# Patient Record
Sex: Female | Born: 1970 | Race: Black or African American | Hispanic: No | Marital: Single | Smoking: Former smoker
Health system: Southern US, Community
[De-identification: ages and names within clinical notes are randomized; demographics above are authoritative.]

## PROBLEM LIST (undated history)

## (undated) DIAGNOSIS — D649 Anemia, unspecified: Secondary | ICD-10-CM

## (undated) DIAGNOSIS — Z5189 Encounter for other specified aftercare: Secondary | ICD-10-CM

## (undated) DIAGNOSIS — I10 Essential (primary) hypertension: Secondary | ICD-10-CM

## (undated) HISTORY — DX: Essential (primary) hypertension: I10

## (undated) HISTORY — DX: Encounter for other specified aftercare: Z51.89

## (undated) HISTORY — DX: Anemia, unspecified: D64.9

## (undated) HISTORY — PX: WISDOM TOOTH EXTRACTION: SHX21

---

## 1998-10-12 ENCOUNTER — Emergency Department (HOSPITAL_COMMUNITY): Admission: EM | Admit: 1998-10-12 | Discharge: 1998-10-12 | Payer: Self-pay | Admitting: Emergency Medicine

## 1999-02-23 ENCOUNTER — Emergency Department (HOSPITAL_COMMUNITY): Admission: EM | Admit: 1999-02-23 | Discharge: 1999-02-24 | Payer: Self-pay | Admitting: Emergency Medicine

## 1999-02-24 ENCOUNTER — Encounter: Payer: Self-pay | Admitting: Emergency Medicine

## 1999-04-06 ENCOUNTER — Other Ambulatory Visit: Admission: RE | Admit: 1999-04-06 | Discharge: 1999-04-06 | Payer: Self-pay | Admitting: Family Medicine

## 1999-12-20 ENCOUNTER — Emergency Department (HOSPITAL_COMMUNITY): Admission: EM | Admit: 1999-12-20 | Discharge: 1999-12-21 | Payer: Self-pay | Admitting: Emergency Medicine

## 1999-12-22 ENCOUNTER — Encounter: Payer: Self-pay | Admitting: Emergency Medicine

## 1999-12-22 ENCOUNTER — Ambulatory Visit (HOSPITAL_COMMUNITY): Admission: RE | Admit: 1999-12-22 | Discharge: 1999-12-22 | Payer: Self-pay | Admitting: Emergency Medicine

## 2001-11-12 ENCOUNTER — Other Ambulatory Visit: Admission: RE | Admit: 2001-11-12 | Discharge: 2001-11-12 | Payer: Self-pay | Admitting: Family Medicine

## 2003-07-25 ENCOUNTER — Other Ambulatory Visit: Admission: RE | Admit: 2003-07-25 | Discharge: 2003-07-25 | Payer: Self-pay | Admitting: Family Medicine

## 2004-02-28 ENCOUNTER — Emergency Department (HOSPITAL_COMMUNITY): Admission: EM | Admit: 2004-02-28 | Discharge: 2004-02-28 | Payer: Self-pay | Admitting: Emergency Medicine

## 2004-08-10 ENCOUNTER — Other Ambulatory Visit: Admission: RE | Admit: 2004-08-10 | Discharge: 2004-08-10 | Payer: Self-pay | Admitting: *Deleted

## 2005-09-19 ENCOUNTER — Other Ambulatory Visit: Admission: RE | Admit: 2005-09-19 | Discharge: 2005-09-19 | Payer: Self-pay | Admitting: Family Medicine

## 2008-09-01 ENCOUNTER — Emergency Department (HOSPITAL_COMMUNITY): Admission: EM | Admit: 2008-09-01 | Discharge: 2008-09-01 | Payer: Self-pay | Admitting: Emergency Medicine

## 2009-02-09 ENCOUNTER — Ambulatory Visit (HOSPITAL_COMMUNITY): Admission: RE | Admit: 2009-02-09 | Discharge: 2009-02-09 | Payer: Self-pay | Admitting: Family Medicine

## 2011-06-21 ENCOUNTER — Encounter: Payer: Self-pay | Admitting: Obstetrics and Gynecology

## 2011-06-28 ENCOUNTER — Encounter: Payer: Self-pay | Admitting: Obstetrics and Gynecology

## 2011-06-28 ENCOUNTER — Ambulatory Visit (INDEPENDENT_AMBULATORY_CARE_PROVIDER_SITE_OTHER): Payer: Private Health Insurance - Indemnity | Admitting: Obstetrics and Gynecology

## 2011-06-28 VITALS — BP 120/82 | HR 70 | Ht 64.5 in | Wt 209.0 lb

## 2011-06-28 DIAGNOSIS — Z1231 Encounter for screening mammogram for malignant neoplasm of breast: Secondary | ICD-10-CM

## 2011-06-28 DIAGNOSIS — Z309 Encounter for contraceptive management, unspecified: Secondary | ICD-10-CM

## 2011-06-28 DIAGNOSIS — IMO0001 Reserved for inherently not codable concepts without codable children: Secondary | ICD-10-CM

## 2011-06-28 DIAGNOSIS — Z01419 Encounter for gynecological examination (general) (routine) without abnormal findings: Secondary | ICD-10-CM

## 2011-06-28 DIAGNOSIS — Z124 Encounter for screening for malignant neoplasm of cervix: Secondary | ICD-10-CM

## 2011-06-28 LAB — POCT URINE PREGNANCY: Preg Test, Ur: NEGATIVE

## 2011-06-28 MED ORDER — ETONOGESTREL-ETHINYL ESTRADIOL 0.12-0.015 MG/24HR VA RING: VAGINAL_RING | VAGINAL | Status: DC

## 2011-06-28 NOTE — Progress Notes (Signed)
Subjective:    Alexis Torres is a 41 y.o. female, G2P0011, who presents for an annual exam. The patient has used BCPs, and Depo Provera in the past but had trouble remembering and gained weight respectively.  Reviewed methods both medical and over the counter and gave a handout on the same.  Wants to try Nuvaring.  Menstrual cycle:   LMP: Patient's last menstrual period was 06/20/2011.             Review of Systems Pertinent items are noted in HPI. Denies pelvic pain, urinary tract symptoms, vaginitis symptoms, irregular bleeding, menopausal symptoms, change in bowel habits or rectal bleeding   Objective:    BP 120/82  Pulse 70  Ht 5' 4.5" (1.638 m)  Wt 209 lb (94.802 kg)  BMI 35.32 kg/m2  LMP 06/20/2011   Wt Readings from Last 1 Encounters:  06/28/11 209 lb (94.802 kg)   Body mass index is 35.32 kg/(m^2). General Appearance: Alert, no acute distress HEENT: Grossly normal Neck / Thyroid: Supple, no thyromegaly or cervical adenopathy Lungs: Clear to auscultation bilaterally Back: No CVA tenderness Breast Exam: No masses or nodes.No dimpling, nipple retraction or discharge. Cardiovascular: Regular rate and rhythm.  Gastrointestinal: Soft, non-tender, no masses or organomegaly Pelvic Exam: EGBUS-wnl, vagina-normal rugae, cervix- without lesions or tenderness, uterus upper limits of normal size;  adnexae-no masses or tenderness Rectovaginal: no masses and normal sphincter tone Lymphatic Exam: Non-palpable nodes in neck, clavicular,  axillary, or inguinal regions  Skin: no rashes or abnormalities Extremities: no clubbing cyanosis or edema  Neurologic: grossly normal Psychiatric: Alert and oriented  Assessment:   Routine GYN Exam Need for Contraception   Plan:  Nuvaring #1 1 pv for 21 of 28 days 11 refills  Nuvagring booklet given along with handout on all contraceptive methods  Reviewed side effects, risks and benefits of Nuvaring to include VTE complications  PAP  sent  Initial mammogram scheduled  RTO 1 year or prn  Danile Trier,ELMIRAPA-C

## 2011-06-28 NOTE — Progress Notes (Signed)
Regular Periods: yes Mammogram: no  Monthly Breast Ex.: yes Exercise: yes  Tetanus < 10 years: yes Seatbelts: yes  NI. Bladder Functn.: yes Abuse at home: no  Daily BM's: yes Stressful Work: yes  Healthy Diet: yes Sigmoid-Colonoscopy: NO  Calcium: no Medical problems this year:  DISCUSS BIRTH CONTROL   LAST PAP2011  Contraception: ABSTINENCE  Mammogram:  TIME FOR MAMMO  PCP: NO   PMH: NO CHANGE  FMH: NO CHANGE  Last Bone Scan: NO

## 2011-06-30 LAB — PAP IG W/ RFLX HPV ASCU

## 2011-07-04 LAB — HUMAN PAPILLOMAVIRUS, HIGH RISK: HPV DNA High Risk: NOT DETECTED

## 2011-07-06 ENCOUNTER — Encounter: Payer: Self-pay | Admitting: Obstetrics and Gynecology

## 2011-08-11 ENCOUNTER — Ambulatory Visit (HOSPITAL_COMMUNITY)
Admission: RE | Admit: 2011-08-11 | Discharge: 2011-08-11 | Disposition: A | Discharge status: Home / Self Care | Payer: Private Health Insurance - Indemnity | Source: Ambulatory Visit | Attending: Obstetrics and Gynecology | Admitting: Obstetrics and Gynecology

## 2011-08-11 DIAGNOSIS — Z1231 Encounter for screening mammogram for malignant neoplasm of breast: Secondary | ICD-10-CM

## 2013-11-11 ENCOUNTER — Encounter: Payer: Self-pay | Admitting: Obstetrics and Gynecology

## 2014-05-14 ENCOUNTER — Emergency Department (HOSPITAL_COMMUNITY)
Admission: EM | Admit: 2014-05-14 | Discharge: 2014-05-15 | Disposition: A | Discharge status: Home / Self Care | Payer: Private Health Insurance - Indemnity | Attending: Emergency Medicine | Admitting: Emergency Medicine

## 2014-05-14 ENCOUNTER — Emergency Department (HOSPITAL_COMMUNITY): Payer: Private Health Insurance - Indemnity

## 2014-05-14 ENCOUNTER — Encounter (HOSPITAL_COMMUNITY): Payer: Self-pay | Admitting: Emergency Medicine

## 2014-05-14 DIAGNOSIS — Z87891 Personal history of nicotine dependence: Secondary | ICD-10-CM | POA: Insufficient documentation

## 2014-05-14 DIAGNOSIS — R079 Chest pain, unspecified: Secondary | ICD-10-CM

## 2014-05-14 DIAGNOSIS — D649 Anemia, unspecified: Secondary | ICD-10-CM | POA: Insufficient documentation

## 2014-05-14 MED ORDER — KETOROLAC TROMETHAMINE 30 MG/ML IJ SOLN
30.0000 mg | Freq: Once | INTRAMUSCULAR | Status: AC
  Administered 2014-05-14: 30 mg via INTRAMUSCULAR
  Filled 2014-05-14: qty 1

## 2014-05-14 NOTE — ED Notes (Signed)
Pt states has a knot on mid chest/R breast area, states same spot she had one before about month and half ago, states she thinks its like a cyst she gets under her arms, states once she realized it was there she started having some shooting pains in chest at times, denies pain at this time. Pt in no distress.

## 2014-05-14 NOTE — ED Notes (Signed)
Pt states that she has a knot in between her breasts. States that she had it before and it went away and came back. Had some chest pain today that has now stopped. Alert and oriented.

## 2014-05-15 LAB — CBC WITH DIFFERENTIAL/PLATELET
BASOS PCT: 0 % (ref 0–1)
Basophils Absolute: 0 10*3/uL (ref 0.0–0.1)
EOS ABS: 0.3 10*3/uL (ref 0.0–0.7)
Eosinophils Relative: 3 % (ref 0–5)
HEMATOCRIT: 31.8 % — AB (ref 36.0–46.0)
HEMOGLOBIN: 9.4 g/dL — AB (ref 12.0–15.0)
LYMPHS ABS: 2.8 10*3/uL (ref 0.7–4.0)
Lymphocytes Relative: 30 % (ref 12–46)
MCH: 21.4 pg — ABNORMAL LOW (ref 26.0–34.0)
MCHC: 29.6 g/dL — ABNORMAL LOW (ref 30.0–36.0)
MCV: 72.4 fL — AB (ref 78.0–100.0)
MONO ABS: 0.8 10*3/uL (ref 0.1–1.0)
MONOS PCT: 8 % (ref 3–12)
Neutro Abs: 5.5 10*3/uL (ref 1.7–7.7)
Neutrophils Relative %: 59 % (ref 43–77)
Platelets: 440 10*3/uL — ABNORMAL HIGH (ref 150–400)
RBC: 4.39 MIL/uL (ref 3.87–5.11)
RDW: 16.1 % — AB (ref 11.5–15.5)
WBC: 9.4 10*3/uL (ref 4.0–10.5)

## 2014-05-15 LAB — I-STAT TROPONIN, ED: Troponin i, poc: 0 ng/mL (ref 0.00–0.08)

## 2014-05-15 LAB — BASIC METABOLIC PANEL
ANION GAP: 4 — AB (ref 5–15)
BUN: 14 mg/dL (ref 6–20)
CALCIUM: 8.9 mg/dL (ref 8.9–10.3)
CO2: 28 mmol/L (ref 22–32)
CREATININE: 0.7 mg/dL (ref 0.44–1.00)
Chloride: 104 mmol/L (ref 101–111)
GFR calc non Af Amer: >60 mL/min (ref >60)
Glucose, Bld: 105 mg/dL — ABNORMAL HIGH (ref 70–99)
Potassium: 4.1 mmol/L (ref 3.5–5.1)
Sodium: 136 mmol/L (ref 135–145)

## 2014-05-15 MED ORDER — FERROUS SULFATE 325 (65 FE) MG PO TABS: 325.0000 mg | ORAL_TABLET | Freq: Every day | ORAL | Status: DC

## 2014-05-15 NOTE — ED Provider Notes (Signed)
CSN: [id number]     Arrival date & time 05/14/14  2014 History   First MD Initiated Contact with Patient 05/14/14 2205     Chief Complaint  Patient presents with  . Knot on Chest      (Consider location/radiation/quality/duration/timing/severity/associated sxs/prior Treatment)  HPI Comments: Patient presents today with a complaint of a knot of her breast.  She reports that she first noticed the knot today and that it has been unchanged since onset.  She states that she had a similar knot a month ago that resolved without intervention.  She also reports that earlier today she began having chest pain inferior to both breasts.  Pain has been constant since that time.  She denies SOB, fever, chills, nausea, vomiting, or diaphoresis.  She denies prior history of cardiac disease.  She denies history of HTN, DM, Hyperlipidemia.  She reports that she smokes 2 cigarettes a month.  Denies prolonged travel or surgeries in the past 4 weeks.  Denies exogenous estrogen use.  Denies LE edema or pain.    The history is provided by the patient.    History reviewed. No pertinent past medical history. Past Surgical History  Procedure Laterality Date  . Wisdom tooth extraction     Family History  Problem Relation Age of Onset  . Heart disease Father   . Cancer Mother     COLON  . Seizures Sister     EPILEPSY   History  Substance Use Topics  . Smoking status: Former Research scientist (life sciences)  . Smokeless tobacco: Never Used     Comment: QUIT IN DEC.  Marland Kitchen Alcohol Use: Yes     Comment: 1 TO 2 BEERS A WEEK   OB History    Gravida Para Term Preterm AB TAB SAB Ectopic Multiple Living   2 1   1     1      Review of Systems  All other systems reviewed and are negative.     Allergies  Review of patient's allergies indicates no known allergies.  Home Medications   Prior to Admission medications   Medication Sig Start Date End Date Taking? Authorizing Provider  etonogestrel-ethinyl estradiol (NUVARING) 0.12-0.015  MG/24HR vaginal ring Insert vaginally and leave in place for 3 consecutive weeks, then remove for 1 week. 06/28/11 06/27/12  Elmira Powell, PA-C   BP 156/87 mmHg  Pulse 61  Temp(Src) 98.4 F (36.9 C) (Oral)  Resp 18  SpO2 100%  LMP 05/05/2014 Physical Exam  Constitutional: She appears well-developed and well-nourished.  HENT:  Head: Normocephalic and atraumatic.  Mouth/Throat: Oropharynx is clear and moist.  Neck: Normal range of motion. Neck supple.  Cardiovascular: Normal rate, regular rhythm and normal heart sounds.   Pulmonary/Chest: Effort normal and breath sounds normal. No respiratory distress. She has no wheezes. She has no rales. She exhibits tenderness.    Abdominal: Soft. Bowel sounds are normal.  Musculoskeletal: Normal range of motion.  Neurological: She is alert.  Skin: Skin is warm and dry.  Psychiatric: She has a normal mood and affect.  Nursing note and vitals reviewed.   ED Course  Procedures (including critical care time) Labs Review Labs Reviewed  CBC WITH DIFFERENTIAL/PLATELET - Abnormal; Notable for the following:    Hemoglobin 9.4 (*)    HCT 31.8 (*)    MCV 72.4 (*)    MCH 21.4 (*)    MCHC 29.6 (*)    RDW 16.1 (*)    Platelets 440 (*)    All other components  within normal limits  BASIC METABOLIC PANEL - Abnormal; Notable for the following:    Glucose, Bld 105 (*)    Anion gap 4 (*)    All other components within normal limits  I-STAT TROPOININ, ED    Imaging Review Dg Chest 2 View  05/15/2014   CLINICAL DATA:  Substernal chest pain for 24 hr  EXAM: CHEST  2 VIEW  COMPARISON:  None.  FINDINGS: Normal mediastinum and cardiac silhouette. Normal pulmonary vasculature. No evidence of effusion, infiltrate, or pneumothorax. No acute bony abnormality.  IMPRESSION: No acute cardiopulmonary process.   Electronically Signed   By: Suzy Bouchard M.D.   On: 05/15/2014 00:09     EKG Interpretation   Date/Time:  Wednesday May 14 2014 20:21:16  EDT Ventricular Rate:  72 PR Interval:  208 QRS Duration: 85 QT Interval:  374 QTC Calculation: 409 R Axis:   50 Text Interpretation:  Sinus rhythm Ventricular premature complex  Borderline prolonged PR interval No old tracing to compare Confirmed by  BELFI  MD, MELANIE ([id number]) on 05/14/2014 11:14:11 PM      MDM   Final diagnoses:  Chest pain   Patient presents today with a knot of her breast.  She reports that she just noticed the area today.  No obvious fluctuant abscess.  No overlying erythema or warmth.  Patient given referral to breast center.  Patient is afebrile.  Patient also reporting chest pain.  Troponin negative.  CXR negative.  No ischemic changes on EKG.  HEART score of 1.  Feel that the patient is stable for discharge.  Return precautions given.    Hyman Bible, PA-C 05/16/14 [id number]  Malvin Johns, MD 05/16/14 [phone]

## 2014-05-15 NOTE — Discharge Instructions (Signed)
It is important for you to follow up with the Breast Center or your Primary Care Physician regarding the knot on your breast.

## 2015-04-30 ENCOUNTER — Encounter (HOSPITAL_COMMUNITY): Payer: Self-pay

## 2015-04-30 ENCOUNTER — Emergency Department (HOSPITAL_COMMUNITY): Payer: 59

## 2015-04-30 ENCOUNTER — Emergency Department (HOSPITAL_COMMUNITY)
Admission: EM | Admit: 2015-04-30 | Discharge: 2015-04-30 | Disposition: A | Discharge status: Home / Self Care | Payer: 59 | Attending: Emergency Medicine | Admitting: Emergency Medicine

## 2015-04-30 DIAGNOSIS — R0602 Shortness of breath: Secondary | ICD-10-CM | POA: Diagnosis not present

## 2015-04-30 DIAGNOSIS — R079 Chest pain, unspecified: Secondary | ICD-10-CM | POA: Diagnosis present

## 2015-04-30 DIAGNOSIS — Z7982 Long term (current) use of aspirin: Secondary | ICD-10-CM | POA: Insufficient documentation

## 2015-04-30 DIAGNOSIS — R2242 Localized swelling, mass and lump, left lower limb: Secondary | ICD-10-CM | POA: Insufficient documentation

## 2015-04-30 DIAGNOSIS — Z87891 Personal history of nicotine dependence: Secondary | ICD-10-CM | POA: Diagnosis not present

## 2015-04-30 DIAGNOSIS — R0789 Other chest pain: Secondary | ICD-10-CM | POA: Insufficient documentation

## 2015-04-30 LAB — BASIC METABOLIC PANEL
Anion gap: 6 (ref 5–15)
BUN: 10 mg/dL (ref 6–20)
CHLORIDE: 105 mmol/L (ref 101–111)
CO2: 26 mmol/L (ref 22–32)
Calcium: 9 mg/dL (ref 8.9–10.3)
Creatinine, Ser: 0.78 mg/dL (ref 0.44–1.00)
GFR calc non Af Amer: >60 mL/min (ref >60)
Glucose, Bld: 103 mg/dL — ABNORMAL HIGH (ref 65–99)
Potassium: 3.8 mmol/L (ref 3.5–5.1)
Sodium: 137 mmol/L (ref 135–145)

## 2015-04-30 LAB — CBC
HCT: 29.7 % — ABNORMAL LOW (ref 36.0–46.0)
Hemoglobin: 9.2 g/dL — ABNORMAL LOW (ref 12.0–15.0)
MCH: 21.5 pg — AB (ref 26.0–34.0)
MCHC: 31 g/dL (ref 30.0–36.0)
MCV: 69.6 fL — AB (ref 78.0–100.0)
Platelets: 443 10*3/uL — ABNORMAL HIGH (ref 150–400)
RBC: 4.27 MIL/uL (ref 3.87–5.11)
RDW: 16.7 % — ABNORMAL HIGH (ref 11.5–15.5)
WBC: 10.5 10*3/uL (ref 4.0–10.5)

## 2015-04-30 LAB — I-STAT TROPONIN, ED: Troponin i, poc: 0 ng/mL (ref 0.00–0.08)

## 2015-04-30 LAB — D-DIMER, QUANTITATIVE: D-Dimer, Quant: 0.58 ug/mL-FEU — ABNORMAL HIGH (ref 0.00–0.50)

## 2015-04-30 MED ORDER — IOPAMIDOL (ISOVUE-370) INJECTION 76%
100.0000 mL | Freq: Once | INTRAVENOUS | Status: AC | PRN
  Administered 2015-04-30: 100 mL via INTRAVENOUS

## 2015-04-30 NOTE — ED Notes (Signed)
Patient states she was shopping and began having left chest pain that radiated to the left mid back. Patient states the chest pain has been intermittent since then. Pain is worse with a deep breath, movement,or cough. Patient states she gets SOB when walking up stairs.

## 2015-04-30 NOTE — Discharge Instructions (Signed)
Nonspecific Chest Pain  °Chest pain can be caused by many different conditions. There is always a chance that your pain could be related to something serious, such as a heart attack or a blood clot in your lungs. Chest pain can also be caused by conditions that are not life-threatening. If you have chest pain, it is very important to follow up with your health care provider. °CAUSES  °Chest pain can be caused by: °· Heartburn. °· Pneumonia or bronchitis. °· Anxiety or stress. °· Inflammation around your heart (pericarditis) or lung (pleuritis or pleurisy). °· A blood clot in your lung. °· A collapsed lung (pneumothorax). It can develop suddenly on its own (spontaneous pneumothorax) or from trauma to the chest. °· Shingles infection (varicella-zoster virus). °· Heart attack. °· Damage to the bones, muscles, and cartilage that make up your chest wall. This can include: °¨ Bruised bones due to injury. °¨ Strained muscles or cartilage due to frequent or repeated coughing or overwork. °¨ Fracture to one or more ribs. °¨ Sore cartilage due to inflammation (costochondritis). °RISK FACTORS  °Risk factors for chest pain may include: °· Activities that increase your risk for trauma or injury to your chest. °· Respiratory infections or conditions that cause frequent coughing. °· Medical conditions or overeating that can cause heartburn. °· Heart disease or family history of heart disease. °· Conditions or health behaviors that increase your risk of developing a blood clot. °· Having had chicken pox (varicella zoster). °SIGNS AND SYMPTOMS °Chest pain can feel like: °· Burning or tingling on the surface of your chest or deep in your chest. °· Crushing, pressure, aching, or squeezing pain. °· Dull or sharp pain that is worse when you move, cough, or take a deep breath. °· Pain that is also felt in your back, neck, shoulder, or arm, or pain that spreads to any of these areas. °Your chest pain may come and go, or it may stay  constant. °DIAGNOSIS °Lab tests or other studies may be needed to find the cause of your pain. Your health care provider may have you take a test called an ambulatory ECG (electrocardiogram). An ECG records your heartbeat patterns at the time the test is performed. You may also have other tests, such as: °· Transthoracic echocardiogram (TTE). During echocardiography, sound waves are used to create a picture of all of the heart structures and to look at how blood flows through your heart. °· Transesophageal echocardiogram (TEE). This is a more advanced imaging test that obtains images from inside your body. It allows your health care provider to see your heart in finer detail. °· Cardiac monitoring. This allows your health care provider to monitor your heart rate and rhythm in real time. °· Holter monitor. This is a portable device that records your heartbeat and can help to diagnose abnormal heartbeats. It allows your health care provider to track your heart activity for several days, if needed. °· Stress tests. These can be done through exercise or by taking medicine that makes your heart beat more quickly. °· Blood tests. °· Imaging tests. °TREATMENT  °Your treatment depends on what is causing your chest pain. Treatment may include: °· Medicines. These may include: °¨ Acid blockers for heartburn. °¨ Anti-inflammatory medicine. °¨ Pain medicine for inflammatory conditions. °¨ Antibiotic medicine, if an infection is present. °¨ Medicines to dissolve blood clots. °¨ Medicines to treat coronary artery disease. °· Supportive care for conditions that do not require medicines. This may include: °¨ Resting. °¨ Applying heat   or cold packs to injured areas. °¨ Limiting activities until pain decreases. °HOME CARE INSTRUCTIONS °· If you were prescribed an antibiotic medicine, finish it all even if you start to feel better. °· Avoid any activities that bring on chest pain. °· Do not use any tobacco products, including  cigarettes, chewing tobacco, or electronic cigarettes. If you need help quitting, ask your health care provider. °· Do not drink alcohol. °· Take medicines only as directed by your health care provider. °· Keep all follow-up visits as directed by your health care provider. This is important. This includes any further testing if your chest pain does not go away. °· If heartburn is the cause for your chest pain, you may be told to keep your head raised (elevated) while sleeping. This reduces the chance that acid will go from your stomach into your esophagus. °· Make lifestyle changes as directed by your health care provider. These may include: °¨ Getting regular exercise. Ask your health care provider to suggest some activities that are safe for you. °¨ Eating a heart-healthy diet. A registered dietitian can help you to learn healthy eating options. °¨ Maintaining a healthy weight. °¨ Managing diabetes, if necessary. °¨ Reducing stress. °SEEK MEDICAL CARE IF: °· Your chest pain does not go away after treatment. °· You have a rash with blisters on your chest. °· You have a fever. °SEEK IMMEDIATE MEDICAL CARE IF:  °· Your chest pain is worse. °· You have an increasing cough, or you cough up blood. °· You have severe abdominal pain. °· You have severe weakness. °· You faint. °· You have chills. °· You have sudden, unexplained chest discomfort. °· You have sudden, unexplained discomfort in your arms, back, neck, or jaw. °· You have shortness of breath at any time. °· You suddenly start to sweat, or your skin gets clammy. °· You feel nauseous or you vomit. °· You suddenly feel light-headed or dizzy. °· Your heart begins to beat quickly, or it feels like it is skipping beats. °These symptoms may represent a serious problem that is an emergency. Do not wait to see if the symptoms will go away. Get medical help right away. Call your local emergency services (911 in the [place] Do not drive yourself to the hospital. °  °This  information is not intended to replace advice given to you by your health care provider. Make sure you discuss any questions you have with your health care provider. °  °Document Released: 10/06/2004 Document Revised: 01/17/2014 Document Reviewed: 08/02/2013 °Elsevier Interactive Patient Education ©2016 [employer] ° °

## 2015-04-30 NOTE — ED Notes (Signed)
MD at bedside. 

## 2015-04-30 NOTE — ED Provider Notes (Signed)
CSN: [id number]     Arrival date & time 04/30/15  1656 History   First MD Initiated Contact with Patient 04/30/15 2022     Chief Complaint  Patient presents with  . Chest Pain    Patient is a 45 y.o. female presenting with chest pain.  Chest Pain Associated symptoms: shortness of breath   Associated symptoms: no abdominal pain, no back pain, no cough, no headache, no nausea, no numbness, not vomiting and no weakness   Patient presents with chest pain. She's had for last few days. Is on her left lower chest. It does come and go somewhat. Comes on sharp. States worse with breathing. Not worse with exertion. It does go away in between but gradually improves. No fevers or chills. She is a former smoker. States she has had some swelling of left foot but has resolved. States she has had more shortness of breath over the last couple weeks. States she's noticed it more since the chest pain started. No Abdominal pain. No fevers. No diaphoresis. No recent travel. She is not on hormonal treatment.  History reviewed. No pertinent past medical history. Past Surgical History  Procedure Laterality Date  . Wisdom tooth extraction     Family History  Problem Relation Age of Onset  . Heart disease Father   . Cancer Mother     COLON  . Seizures Sister     EPILEPSY   Social History  Substance Use Topics  . Smoking status: Former Research scientist (life sciences)  . Smokeless tobacco: Never Used     Comment: QUIT IN DEC.  Marland Kitchen Alcohol Use: No   OB History    Gravida Para Term Preterm AB TAB SAB Ectopic Multiple Living   2 1   1     1      Review of Systems  Constitutional: Negative for activity change and appetite change.  Eyes: Negative for pain.  Respiratory: Positive for shortness of breath. Negative for cough and chest tightness.   Cardiovascular: Positive for chest pain. Negative for leg swelling.  Gastrointestinal: Negative for nausea, vomiting, abdominal pain and diarrhea.  Genitourinary: Negative for flank pain.   Musculoskeletal: Negative for back pain and neck stiffness.  Skin: Negative for rash.  Neurological: Negative for weakness, numbness and headaches.  Psychiatric/Behavioral: Negative for behavioral problems.      Allergies  Review of patient's allergies indicates no known allergies.  Home Medications   Prior to Admission medications   Medication Sig Start Date End Date Taking? Authorizing Provider  aspirin EC 325 MG tablet Take 325 mg by mouth every 6 (six) hours as needed for moderate pain.   Yes Historical Provider, MD  naproxen sodium (ANAPROX) 220 MG tablet Take 220 mg by mouth 2 (two) times daily as needed (pain).   Yes Historical Provider, MD  etonogestrel-ethinyl estradiol (NUVARING) 0.12-0.015 MG/24HR vaginal ring Insert vaginally and leave in place for 3 consecutive weeks, then remove for 1 week. 06/28/11 06/27/12  Earnstine Regal, PA-C  ferrous sulfate 325 (65 FE) MG tablet Take 1 tablet (325 mg total) by mouth daily. Patient not taking: Reported on 04/30/2015 05/15/14   Hyman Bible, PA-C   BP 147/78 mmHg  Pulse 60  Temp(Src) 98.5 F (36.9 C) (Oral)  Resp 20  SpO2 97%  LMP 04/16/2015 Physical Exam  Constitutional: She is oriented to person, place, and time. She appears well-developed and well-nourished.  HENT:  Head: Normocephalic and atraumatic.  Neck: No JVD present.  Cardiovascular: Normal rate, regular rhythm and normal  heart sounds.   No murmur heard. Pulmonary/Chest: Effort normal and breath sounds normal. No respiratory distress. She has no wheezes. She has no rales. She exhibits tenderness.  Tenderness to left anterior lower chest wall. No rash. No crepitance or deformity.  Abdominal: Soft. Bowel sounds are normal. She exhibits no distension. There is no tenderness. There is no rebound and no guarding.  Musculoskeletal: Normal range of motion. She exhibits no edema.  Neurological: She is alert and oriented to person, place, and time. No cranial nerve deficit.   Skin: Skin is warm and dry.  Psychiatric: Her speech is normal.  Nursing note and vitals reviewed.   ED Course  Procedures (including critical care time) Labs Review Labs Reviewed  BASIC METABOLIC PANEL - Abnormal; Notable for the following:    Glucose, Bld 103 (*)    All other components within normal limits  CBC - Abnormal; Notable for the following:    Hemoglobin 9.2 (*)    HCT 29.7 (*)    MCV 69.6 (*)    MCH 21.5 (*)    RDW 16.7 (*)    Platelets 443 (*)    All other components within normal limits  D-DIMER, QUANTITATIVE (NOT AT [facility]) - Abnormal; Notable for the following:    D-Dimer, Quant 0.58 (*)    All other components within normal limits  I-STAT TROPOININ, ED    Imaging Review Dg Chest 2 View  04/30/2015  CLINICAL DATA:  Left-sided chest pain radiating into the back since noon today. Shortness of breath with exertion. Initial encounter. EXAM: CHEST  2 VIEW COMPARISON:  PA and lateral chest 05/14/2014. FINDINGS: Lung volumes are slightly lower than on the comparison study resulting in accentuation of the cardiac silhouette. The lungs are clear. There is no pneumothorax or pleural effusion. No focal bony abnormality. IMPRESSION: No acute disease. Electronically Signed   By: Inge Rise M.D.   On: 04/30/2015 17:59   Ct Angio Chest Pe W/cm &/or Wo Cm  04/30/2015  CLINICAL DATA:  Left-sided chest pain radiating to the left mid back. Shortness of breath when walking up stairs. Elevated D-dimer. Ex-smoker. EXAM: CT ANGIOGRAPHY CHEST WITH CONTRAST TECHNIQUE: Multidetector CT imaging of the chest was performed using the standard protocol during bolus administration of intravenous contrast. Multiplanar CT image reconstructions and MIPs were obtained to evaluate the vascular anatomy. CONTRAST:  72 mL Isovue 370 COMPARISON:  None. FINDINGS: Technically adequate study with good opacification of the central and segmental pulmonary arteries. No focal filling defects. No evidence of  significant pulmonary embolus. Normal heart size. Normal caliber thoracic aorta. No evidence of aortic dissection. Great vessel origins are patent. Esophagus is decompressed. Moderate prominent lymph nodes in the axilla bilaterally, likely reactive. No significant mediastinal lymphadenopathy. Mild dependent atelectasis in the lung bases. No focal airspace disease or consolidation in the lungs. No pleural effusions. No pneumothorax. Airways are patent. Included portions of the upper abdominal organs are grossly unremarkable. Mild degenerative changes in the spine. No destructive bone lesions. Review of the MIP images confirms the above findings. IMPRESSION: No evidence of significant pulmonary embolus. No evidence of active pulmonary disease. Electronically Signed   By: Lucienne Capers M.D.   On: 04/30/2015 21:52   I have personally reviewed and evaluated these images and lab results as part of my medical decision-making.   EKG Interpretation   Date/Time:  Thursday April 30 2015 17:18:18 EDT Ventricular Rate:  82 PR Interval:  172 QRS Duration: 83 QT Interval:  374 QTC  Calculation: 437 R Axis:   80 Text Interpretation:  Sinus rhythm Borderline T abnormalities, anterior  leads Confirmed by Alvino Chapel  MD, Sanjit Mcmichael [id number]) on 04/30/2015 8:23:58 PM      MDM   Final diagnoses:  Chest pain, unspecified chest pain type    Patient presents with chest pain. Did have swelling the foot and reported dyspnea. D-dimer done and was not negative. CT angiography reassuring. Doubt cardiac cause. Will discharge home. Could potentially be dermatomal distribution and patient was told to watch for rash.    Davonna Belling, MD 04/30/15 2203

## 2015-04-30 NOTE — ED Notes (Signed)
Pt transported to CT ?

## 2015-04-30 NOTE — ED Notes (Signed)
Pt ambulated to restroom without difficulty

## 2015-12-31 ENCOUNTER — Encounter (HOSPITAL_COMMUNITY): Payer: Self-pay | Admitting: *Deleted

## 2015-12-31 ENCOUNTER — Emergency Department (HOSPITAL_COMMUNITY): Payer: 59

## 2015-12-31 ENCOUNTER — Emergency Department (HOSPITAL_COMMUNITY)
Admission: EM | Admit: 2015-12-31 | Discharge: 2015-12-31 | Disposition: A | Discharge status: Home / Self Care | Payer: 59 | Attending: Emergency Medicine | Admitting: Emergency Medicine

## 2015-12-31 DIAGNOSIS — R071 Chest pain on breathing: Secondary | ICD-10-CM | POA: Diagnosis present

## 2015-12-31 DIAGNOSIS — R05 Cough: Secondary | ICD-10-CM | POA: Insufficient documentation

## 2015-12-31 DIAGNOSIS — R0789 Other chest pain: Secondary | ICD-10-CM | POA: Insufficient documentation

## 2015-12-31 DIAGNOSIS — F17210 Nicotine dependence, cigarettes, uncomplicated: Secondary | ICD-10-CM | POA: Insufficient documentation

## 2015-12-31 DIAGNOSIS — R059 Cough, unspecified: Secondary | ICD-10-CM

## 2015-12-31 LAB — BASIC METABOLIC PANEL
Anion gap: 7 (ref 5–15)
BUN: 11 mg/dL (ref 6–20)
CALCIUM: 8.6 mg/dL — AB (ref 8.9–10.3)
CO2: 24 mmol/L (ref 22–32)
CREATININE: 0.67 mg/dL (ref 0.44–1.00)
Chloride: 105 mmol/L (ref 101–111)
GFR calc Af Amer: >60 mL/min (ref >60)
GLUCOSE: 113 mg/dL — AB (ref 65–99)
POTASSIUM: 3.7 mmol/L (ref 3.5–5.1)
Sodium: 136 mmol/L (ref 135–145)

## 2015-12-31 LAB — CBC
HCT: 29.8 % — ABNORMAL LOW (ref 36.0–46.0)
Hemoglobin: 9.3 g/dL — ABNORMAL LOW (ref 12.0–15.0)
MCH: 21.8 pg — ABNORMAL LOW (ref 26.0–34.0)
MCHC: 31.2 g/dL (ref 30.0–36.0)
MCV: 69.8 fL — ABNORMAL LOW (ref 78.0–100.0)
PLATELETS: 407 10*3/uL — AB (ref 150–400)
RBC: 4.27 MIL/uL (ref 3.87–5.11)
RDW: 17.8 % — AB (ref 11.5–15.5)
WBC: 10.4 10*3/uL (ref 4.0–10.5)

## 2015-12-31 LAB — I-STAT TROPONIN, ED: Troponin i, poc: 0 ng/mL (ref 0.00–0.08)

## 2015-12-31 MED ORDER — PREDNISONE 20 MG PO TABS
60.0000 mg | ORAL_TABLET | Freq: Once | ORAL | Status: AC
  Administered 2015-12-31: 60 mg via ORAL
  Filled 2015-12-31: qty 3

## 2015-12-31 MED ORDER — ALBUTEROL SULFATE HFA 108 (90 BASE) MCG/ACT IN AERS: 2.0000 | INHALATION_SPRAY | RESPIRATORY_TRACT | 0 refills | Status: DC | PRN

## 2015-12-31 MED ORDER — PREDNISONE 50 MG PO TABS: 50.0000 mg | ORAL_TABLET | Freq: Every day | ORAL | 0 refills | Status: DC

## 2015-12-31 MED ORDER — BUDESONIDE 90 MCG/ACT IN AEPB: 1.0000 | INHALATION_SPRAY | Freq: Two times a day (BID) | RESPIRATORY_TRACT | 0 refills | Status: DC

## 2015-12-31 MED ORDER — IPRATROPIUM-ALBUTEROL 0.5-2.5 (3) MG/3ML IN SOLN
3.0000 mL | Freq: Once | RESPIRATORY_TRACT | Status: AC
  Administered 2015-12-31: 3 mL via RESPIRATORY_TRACT
  Filled 2015-12-31: qty 3

## 2015-12-31 NOTE — ED Provider Notes (Signed)
[facility] DEPT Provider Note   CSN: [id number] Arrival date & time: 12/31/15  0310     History   Chief Complaint Chief Complaint  Patient presents with  . Chest Pain  . Cough    HPI Alexis Torres is a 45 y.o. female.  She has had a cough for the last several weeks. Cough is nonproductive and tends to be worse when she is at work at a [employer]. Last night, she developed a sharp left-sided chest pain which is worse when she takes a deep breath, coughs, or twists to the right. She denies any dyspnea. She denies fever or chills. 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   The history is provided by the patient.  Chest Pain   Associated symptoms include cough.  Cough  Associated symptoms include chest pain.    History reviewed. No pertinent past medical history.  There are no active problems to display for this patient.   Past Surgical History:  Procedure Laterality Date  . WISDOM TOOTH EXTRACTION      OB History    Gravida Para Term Preterm AB Living   2 1     1 1    SAB TAB Ectopic Multiple Live Births                   Home Medications    Prior to Admission medications   Medication Sig Start Date End Date Taking? Authorizing Provider  naproxen sodium (ANAPROX) 220 MG tablet Take 220 mg by mouth 2 (two) times daily as needed (pain).   Yes Historical Provider, MD  etonogestrel-ethinyl estradiol (NUVARING) 0.12-0.015 MG/24HR vaginal ring Insert vaginally and leave in place for 3 consecutive weeks, then remove for 1 week. 06/28/11 06/27/12  Earnstine Regal, PA-C  ferrous sulfate 325 (65 FE) MG tablet Take 1 tablet (325 mg total) by mouth daily. Patient not taking: Reported on 04/30/2015 05/15/14   Hyman Bible, PA-C    Family History Family History  Problem Relation Age of Onset  . Heart  disease Father   . Cancer Mother     COLON  . Seizures Sister     EPILEPSY    Social History Social History  Substance Use Topics  . Smoking status: Current Some Day Smoker    Types: Cigarettes  . Smokeless tobacco: Never Used     Comment: QUIT IN DEC.  Marland Kitchen Alcohol use No     Allergies   Patient has no known allergies.   Review of Systems Review of Systems  Respiratory: Positive for cough.   Cardiovascular: Positive for chest pain.  All other systems reviewed and are negative.    Physical Exam Updated Vital Signs BP 152/79   Pulse 67   Temp 98.2 F (36.8 C)   Resp 16   LMP 12/20/2015   SpO2 98%   Physical Exam  Nursing note and vitals reviewed.  45 year old female, resting comfortably and in no acute distress. Vital signs are Significant for hypertension. Oxygen saturation is 98%, which is normal. Head is normocephalic and atraumatic. PERRLA, EOMI. Oropharynx is clear. Neck is nontender and supple without adenopathy or JVD. Back is nontender and there is no CVA tenderness. Lungs are clear without rales, wheezes, or rhonchi. Chest is mildly tender in the left inframammary area. Heart has  regular rate and rhythm without murmur. Abdomen is soft, flat, nontender without masses or hepatosplenomegaly and peristalsis is normoactive. Extremities have no cyanosis or edema, full range of motion is present. Skin is warm and dry without rash. Neurologic: Mental status is normal, cranial nerves are intact, there are no motor or sensory deficits.  ED Treatments / Results  Labs (all labs ordered are listed, but only abnormal results are displayed) Labs Reviewed  BASIC METABOLIC PANEL - Abnormal; Notable for the following:       Result Value   Glucose, Bld 113 (*)    Calcium 8.6 (*)    All other components within normal limits  CBC - Abnormal; Notable for the following:    Hemoglobin 9.3 (*)    HCT 29.8 (*)    MCV 69.8 (*)    MCH 21.8 (*)    RDW 17.8 (*)     Platelets 407 (*)    All other components within normal limits  I-STAT TROPOININ, ED    EKG  EKG Interpretation  Date/Time:  Thursday December 31 2015 03:23:21 EST Ventricular Rate:  67 PR Interval:    QRS Duration: 84 QT Interval:  405 QTC Calculation: 428 R Axis:   38 Text Interpretation:  Sinus rhythm Normal ECG When compared with ECG of 04/30/2015, Nonspecific T wave abnormality is no longer Present Confirmed by Roxanne Mins  MD, Harl Wiechmann ([id number]) on 12/31/2015 3:41:04 AM       Radiology Dg Chest 2 View  Result Date: 12/31/2015 CLINICAL DATA:  Left-sided chest pain. Cough and congestion for 1 week EXAM: CHEST  2 VIEW COMPARISON:  04/30/2015 FINDINGS: Mild cardiomegaly, unchanged. The lungs are clear except for minimal linear atelectasis in the left base. No pleural effusions. Normal pulmonary vasculature. Hilar and mediastinal contours are unremarkable and unchanged. IMPRESSION: Stable cardiomegaly.  No consolidation or effusion. Electronically Signed   By: Andreas Newport M.D.   On: 12/31/2015 03:49    Procedures Procedures (including critical care time)  Medications Ordered in ED Medications  predniSONE (DELTASONE) tablet 60 mg (not administered)  ipratropium-albuterol (DUONEB) 0.5-2.5 (3) MG/3ML nebulizer solution 3 mL (3 mLs Nebulization Given 12/31/15 0601)     Initial Impression / Assessment and Plan / ED Course  I have reviewed the triage vital signs and the nursing notes.  Pertinent labs & imaging results that were available during my care of the patient were reviewed by me and considered in my medical decision making (see chart for details).  Clinical Course    Pleuritic chest pain which appears to be musculoskeletal-probably from persistent cough. Chest x-ray showed no evidence of pneumonia. Laboratory workup showed moderate to severe anemia but unchanged from baseline. Anemia is microcytic but patient denies heavy menses. Suspect possible thalassemia trait. She is  given a nebulizer treatment with albuterol and ipratropium. Old records are reviewed, and she has no relevant past visits.  She noted significant subjective improvement with albuterol and ipratropium. She is discharged with prescriptions for prednisone, albuterol inhaler, and Pulmicort inhaler.  Final Clinical Impressions(s) / ED Diagnoses   Final diagnoses:  Chest wall pain  Cough    New Prescriptions New Prescriptions   ALBUTEROL (PROVENTIL HFA;VENTOLIN HFA) 108 (90 BASE) MCG/ACT INHALER    Inhale 2 puffs into the lungs every 4 (four) hours as needed for wheezing or shortness of breath (or coughing).   BUDESONIDE (PULMICORT FLEXHALER) 90 MCG/ACT INHALER    Inhale 1 puff into the lungs 2 (two) times daily.   PREDNISONE (DELTASONE) 50  MG TABLET    Take 1 tablet (50 mg total) by mouth daily.     Delora Fuel, MD [id number]

## 2015-12-31 NOTE — ED Notes (Signed)
Patient returned from radiology, patient remains on cardiac monitor, family member at bedside. NAD noted

## 2015-12-31 NOTE — ED Notes (Signed)
Patient to radiology.

## 2015-12-31 NOTE — ED Triage Notes (Signed)
Pt c/o cough x 1 month, left sided chest pain since 7 pm last night with sob, no n/v, denies fever

## 2018-03-22 IMAGING — CT CT ANGIO CHEST
2 of 6 series · 19 of 36 positions shown · IV contrast (isovue)
Comparison: None.

CLINICAL DATA: Left-sided chest pain radiating to the left mid
back. Shortness of breath when walking up stairs. Elevated D-dimer.
Ex-smoker.

EXAM:
CT ANGIOGRAPHY CHEST WITH CONTRAST
TECHNIQUE: Multidetector CT imaging of the chest was performed using the
standard protocol during bolus administration of intravenous
contrast. Multiplanar CT image reconstructions and MIPs were
obtained to evaluate the vascular anatomy.
CONTRAST:  72 mL Isovue 370

[Series 5: coronal mpr · coronal · 0.50mm/px · 1 of 107 slices shown]
[im 54/107  mediastinal]
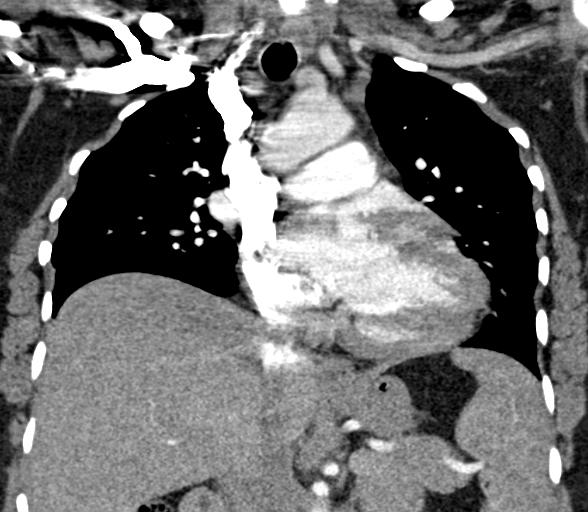

[Series 10: thins for pacs · axial · 0.63mm/px · z∈[+88,+318]mm · 18 of 256 slices shown]
[im 13/256  lung]
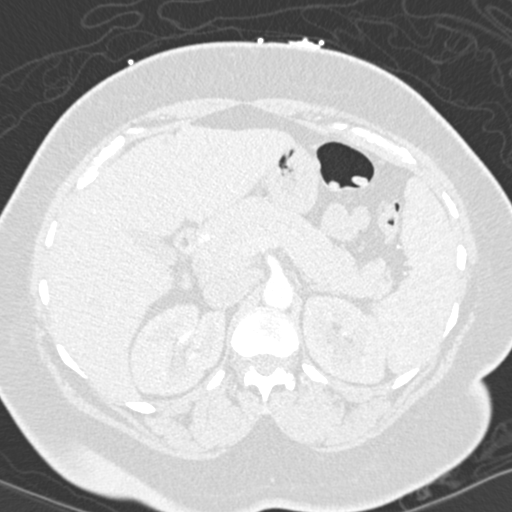
[im 26/256  mediastinal]
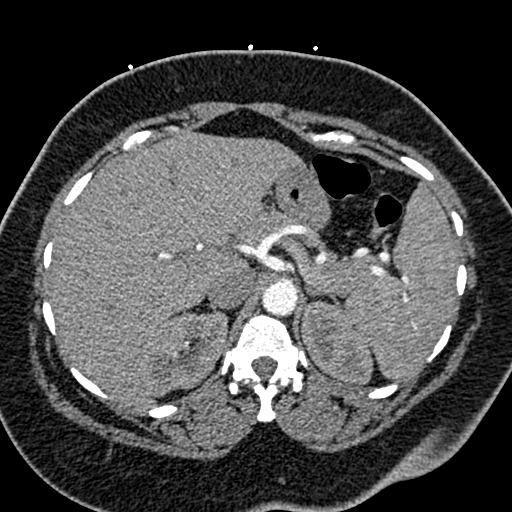
[im 39/256  lung]
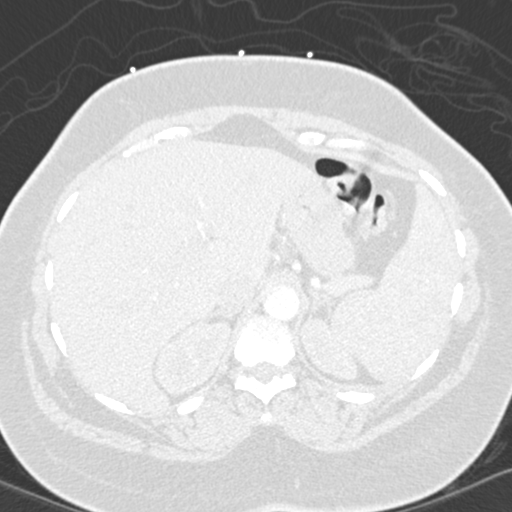
[im 52/256  mediastinal]
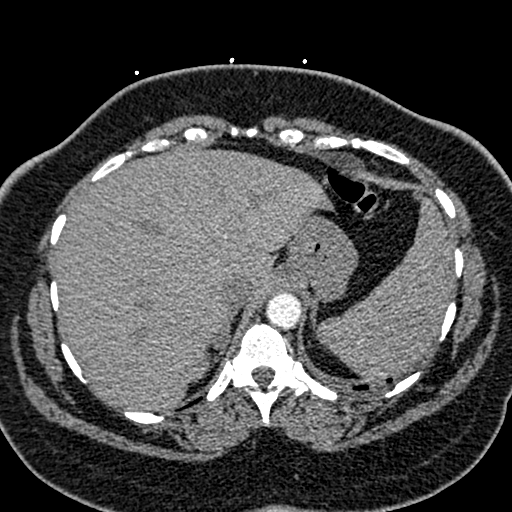
[im 64/256  lung]
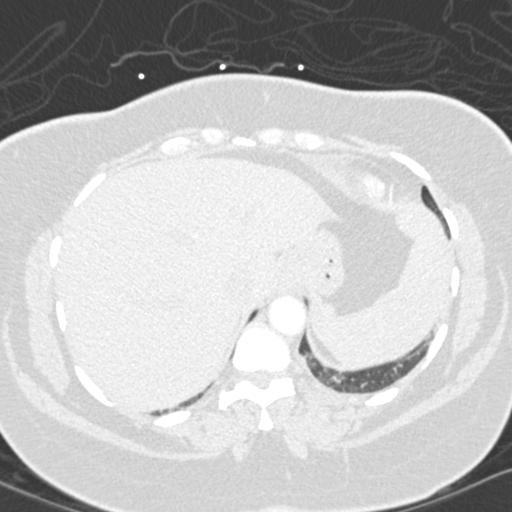
[im 77/256  mediastinal]
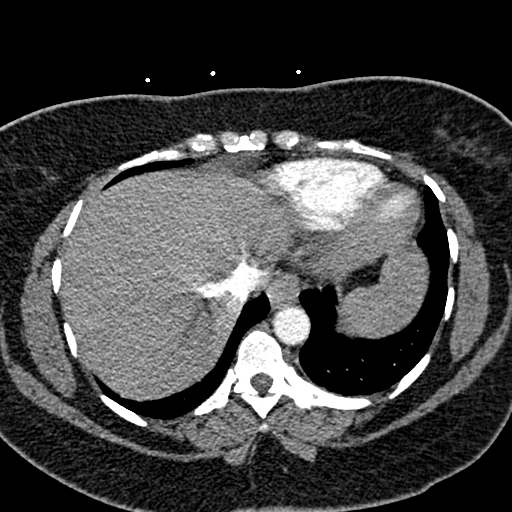
[im 90/256  lung]
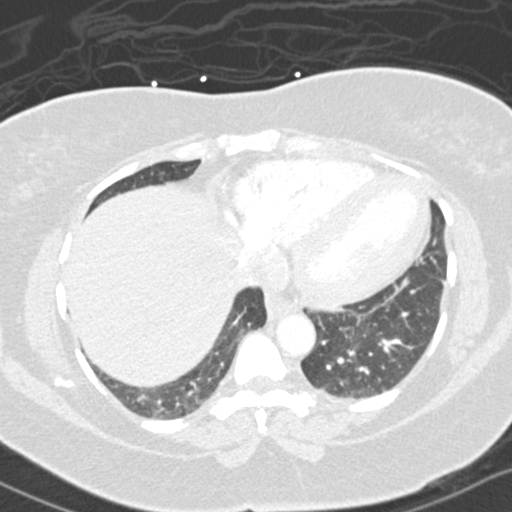
[im 103/256  mediastinal]
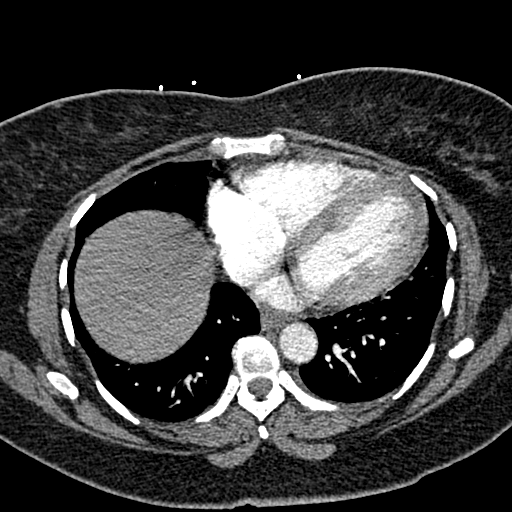
[im 115/256  lung]
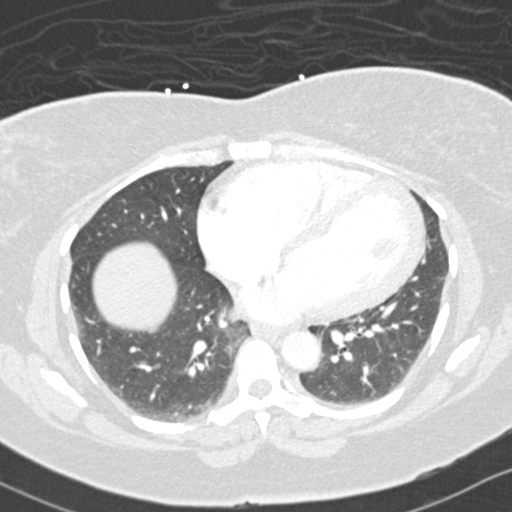
[im 141/256  mediastinal]
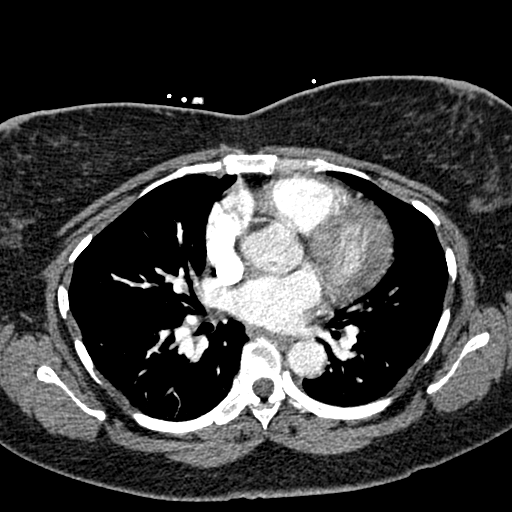
[im 154/256  lung]
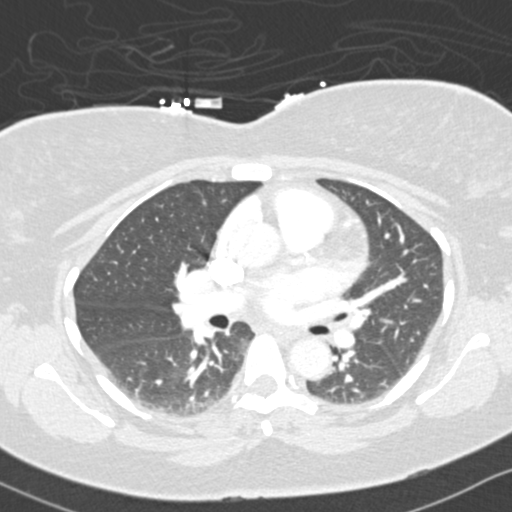
[im 166/256  mediastinal]
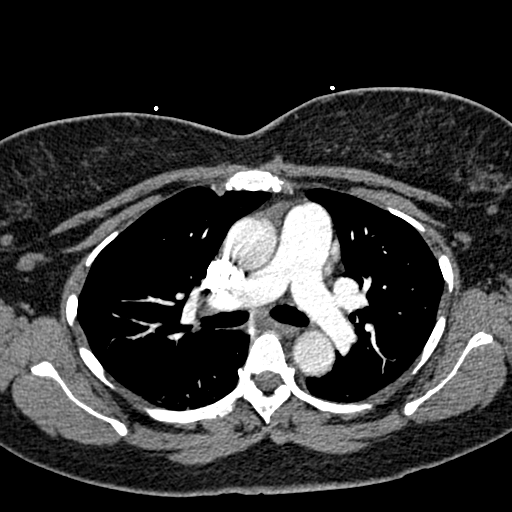
[im 179/256  lung]
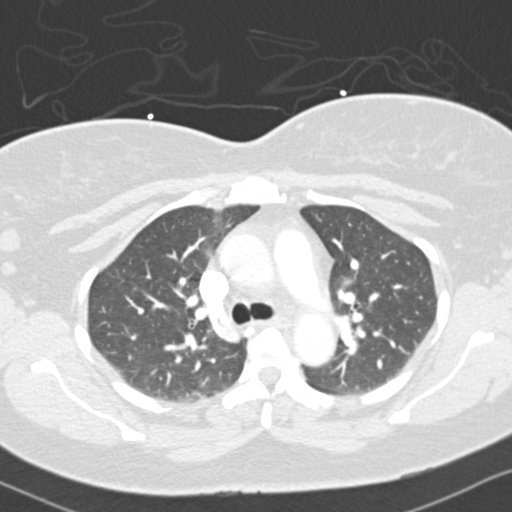
[im 192/256  mediastinal]
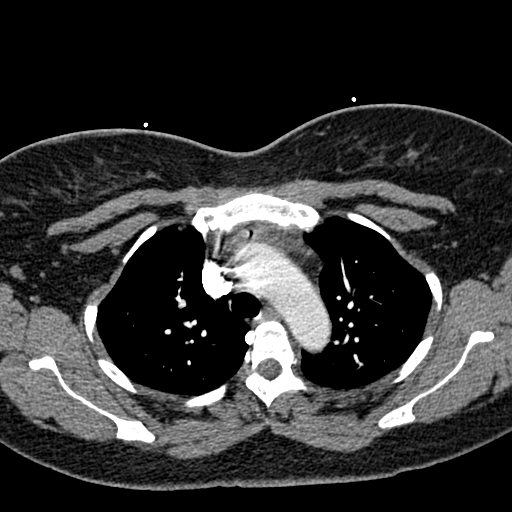
[im 205/256  lung]
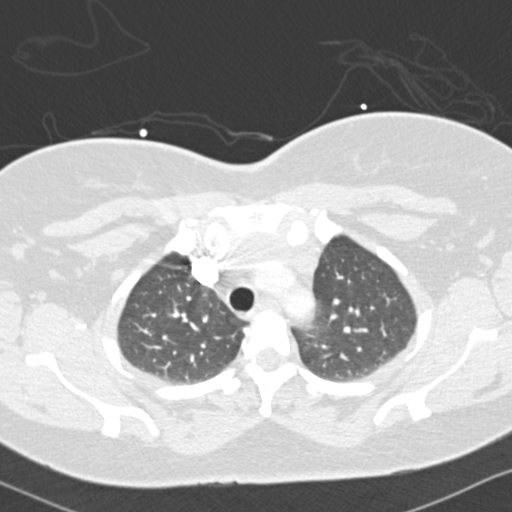
[im 217/256  mediastinal]
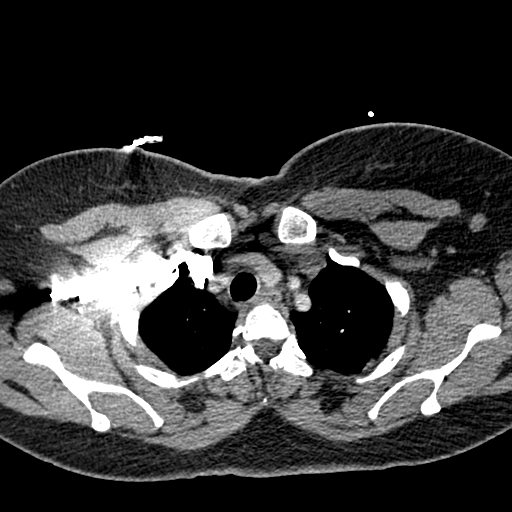
[im 230/256  lung]
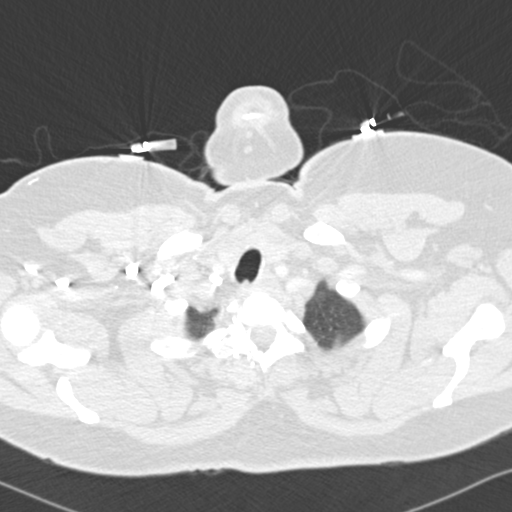
[im 243/256  mediastinal]
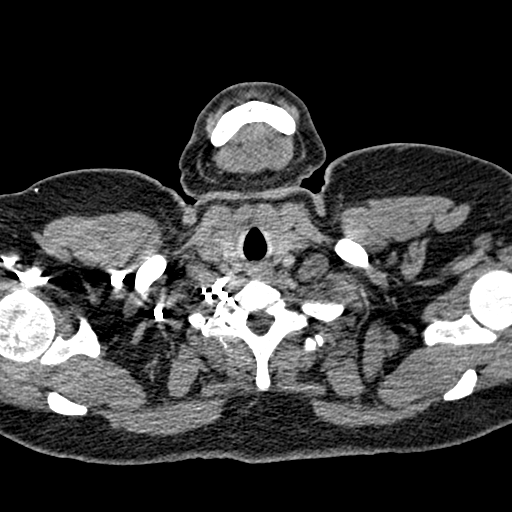

[19 of 36 positions shown; findings below may reference images not displayed]

FINDINGS: Technically adequate study with good opacification of the central
and segmental pulmonary arteries. No focal filling defects. No
evidence of significant pulmonary embolus.

Normal heart size. Normal caliber thoracic aorta. No evidence of
aortic dissection. Great vessel origins are patent. Esophagus is
decompressed. Moderate prominent lymph nodes in the axilla
bilaterally, likely reactive. No significant mediastinal
lymphadenopathy.

Mild dependent atelectasis in the lung bases. No focal airspace
disease or consolidation in the lungs. No pleural effusions. No
pneumothorax. Airways are patent.

Included portions of the upper abdominal organs are grossly
unremarkable. Mild degenerative changes in the spine. No destructive
bone lesions.

Review of the MIP images confirms the above findings.
IMPRESSION: No evidence of significant pulmonary embolus. No evidence of active
pulmonary disease.

## 2022-02-07 ENCOUNTER — Telehealth: Payer: 59 | Admitting: Physician Assistant

## 2022-02-07 DIAGNOSIS — B9689 Other specified bacterial agents as the cause of diseases classified elsewhere: Secondary | ICD-10-CM | POA: Diagnosis not present

## 2022-02-07 DIAGNOSIS — J208 Acute bronchitis due to other specified organisms: Secondary | ICD-10-CM | POA: Diagnosis not present

## 2022-02-07 MED ORDER — AZITHROMYCIN 250 MG PO TABS: ORAL_TABLET | ORAL | 0 refills | Status: DC

## 2022-02-07 MED ORDER — BENZONATATE 100 MG PO CAPS: 100.0000 mg | ORAL_CAPSULE | Freq: Three times a day (TID) | ORAL | 0 refills | Status: DC | PRN

## 2022-02-07 NOTE — Progress Notes (Signed)
We are sorry that you are not feeling well.  Here is how we plan to help!  Based on your presentation I believe you most likely have A cough due to bacteria.  When patients have a fever and a productive cough with a change in color or increased sputum production, we are concerned about bacterial bronchitis.  If left untreated it can progress to pneumonia.  If your symptoms do not improve with your treatment plan it is important that you contact your provider.   I have prescribed Azithromyin 250 mg: two tablets now and then one tablet daily for 4 additonal days    In addition you may use A non-prescription cough medication called Mucinex DM: take 2 tablets every 12 hours. and A prescription cough medication called Tessalon Perles 100mg. You may take 1-2 capsules every 8 hours as needed for your cough.   From your responses in the eVisit questionnaire you describe inflammation in the upper respiratory tract which is causing a significant cough.  This is commonly called Bronchitis and has four common causes:   Allergies Viral Infections Acid Reflux Bacterial Infection Allergies, viruses and acid reflux are treated by controlling symptoms or eliminating the cause. An example might be a cough caused by taking certain blood pressure medications. You stop the cough by changing the medication. Another example might be a cough caused by acid reflux. Controlling the reflux helps control the cough.  USE OF BRONCHODILATOR ("RESCUE") INHALERS: There is a risk from using your bronchodilator too frequently.  The risk is that over-reliance on a medication which only relaxes the muscles surrounding the breathing tubes can reduce the effectiveness of medications prescribed to reduce swelling and congestion of the tubes themselves.  Although you feel brief relief from the bronchodilator inhaler, your asthma may actually be worsening with the tubes becoming more swollen and filled with mucus.  This can delay other  crucial treatments, such as oral steroid medications. If you need to use a bronchodilator inhaler daily, several times per day, you should discuss this with your provider.  There are probably better treatments that could be used to keep your asthma under control.     HOME CARE Only take medications as instructed by your medical team. Complete the entire course of an antibiotic. Drink plenty of fluids and get plenty of rest. Avoid close contacts especially the very young and the elderly Cover your mouth if you cough or cough into your sleeve. Always remember to wash your hands A steam or ultrasonic humidifier can help congestion.   GET HELP RIGHT AWAY IF: You develop worsening fever. You become short of breath You cough up blood. Your symptoms persist after you have completed your treatment plan MAKE SURE YOU  Understand these instructions. Will watch your condition. Will get help right away if you are not doing well or get worse.    Thank you for choosing an e-visit.  Your e-visit answers were reviewed by a board certified advanced clinical practitioner to complete your personal care plan. Depending upon the condition, your plan could have included both over the counter or prescription medications.  Please review your pharmacy choice. Make sure the pharmacy is open so you can pick up prescription now. If there is a problem, you may contact your provider through MyChart messaging and have the prescription routed to another pharmacy.  Your safety is important to us. If you have drug allergies check your prescription carefully.   For the next 24 hours you can use   MyChart to ask questions about today's visit, request a non-urgent call back, or ask for a work or school excuse. You will get an email in the next two days asking about your experience. I hope that your e-visit has been valuable and will speed your recovery.  I have spent 5 minutes in review of e-visit questionnaire, review and  updating patient chart, medical decision making and response to patient.   Chandel Zaun M Hazel Wrinkle, PA-C  

## 2022-02-09 ENCOUNTER — Other Ambulatory Visit: Payer: Self-pay

## 2022-02-09 ENCOUNTER — Emergency Department: Payer: 59

## 2022-02-09 ENCOUNTER — Ambulatory Visit: Payer: Private Health Insurance - Indemnity

## 2022-02-09 ENCOUNTER — Observation Stay
Admission: EM | Admit: 2022-02-09 | Discharge: 2022-02-10 | Disposition: A | Discharge status: Home / Self Care | Payer: 59 | Attending: Internal Medicine | Admitting: Internal Medicine

## 2022-02-09 DIAGNOSIS — N939 Abnormal uterine and vaginal bleeding, unspecified: Secondary | ICD-10-CM | POA: Insufficient documentation

## 2022-02-09 DIAGNOSIS — Z7951 Long term (current) use of inhaled steroids: Secondary | ICD-10-CM | POA: Diagnosis not present

## 2022-02-09 DIAGNOSIS — R739 Hyperglycemia, unspecified: Secondary | ICD-10-CM

## 2022-02-09 DIAGNOSIS — E876 Hypokalemia: Secondary | ICD-10-CM

## 2022-02-09 DIAGNOSIS — D72829 Elevated white blood cell count, unspecified: Secondary | ICD-10-CM

## 2022-02-09 DIAGNOSIS — R531 Weakness: Secondary | ICD-10-CM | POA: Diagnosis present

## 2022-02-09 DIAGNOSIS — F17210 Nicotine dependence, cigarettes, uncomplicated: Secondary | ICD-10-CM | POA: Insufficient documentation

## 2022-02-09 DIAGNOSIS — D649 Anemia, unspecified: Secondary | ICD-10-CM | POA: Diagnosis not present

## 2022-02-09 LAB — HCG, QUANTITATIVE, PREGNANCY: hCG, Beta Chain, Quant, S: <1 m[IU]/mL (ref <5)

## 2022-02-09 LAB — TYPE AND SCREEN

## 2022-02-09 LAB — IRON AND TIBC
Iron: 15 ug/dL — ABNORMAL LOW (ref 28–170)
Saturation Ratios: 4 % — ABNORMAL LOW (ref 10.4–31.8)
TIBC: 395 ug/dL (ref 250–450)
UIBC: 380 ug/dL

## 2022-02-09 LAB — CBC WITH DIFFERENTIAL/PLATELET
Abs Immature Granulocytes: 0.06 10*3/uL (ref 0.00–0.07)
Basophils Absolute: 0 10*3/uL (ref 0.0–0.1)
Basophils Relative: 0 %
Eosinophils Absolute: 0.4 10*3/uL (ref 0.0–0.5)
Eosinophils Relative: 3 %
HCT: 23.3 % — ABNORMAL LOW (ref 36.0–46.0)
Hemoglobin: 6.7 g/dL — ABNORMAL LOW (ref 12.0–15.0)
Immature Granulocytes: 0 %
Lymphocytes Relative: 12 %
Lymphs Abs: 1.8 10*3/uL (ref 0.7–4.0)
MCH: 19.8 pg — ABNORMAL LOW (ref 26.0–34.0)
MCHC: 28.8 g/dL — ABNORMAL LOW (ref 30.0–36.0)
MCV: 68.7 fL — ABNORMAL LOW (ref 80.0–100.0)
Monocytes Absolute: 1 10*3/uL (ref 0.1–1.0)
Monocytes Relative: 7 %
Neutro Abs: 12.1 10*3/uL — ABNORMAL HIGH (ref 1.7–7.7)
Neutrophils Relative %: 78 %
Platelets: 338 10*3/uL (ref 150–400)
RBC: 3.39 MIL/uL — ABNORMAL LOW (ref 3.87–5.11)
RDW: 18.3 % — ABNORMAL HIGH (ref 11.5–15.5)
Smear Review: NORMAL
WBC: 15.4 10*3/uL — ABNORMAL HIGH (ref 4.0–10.5)
nRBC: 0 % (ref 0.0–0.2)

## 2022-02-09 LAB — BASIC METABOLIC PANEL
Anion gap: 9 (ref 5–15)
BUN: 9 mg/dL (ref 6–20)
CO2: 26 mmol/L (ref 22–32)
Calcium: 8.5 mg/dL — ABNORMAL LOW (ref 8.9–10.3)
Chloride: 101 mmol/L (ref 98–111)
Creatinine, Ser: 0.8 mg/dL (ref 0.44–1.00)
GFR, Estimated: >60 mL/min (ref >60)
Glucose, Bld: 147 mg/dL — ABNORMAL HIGH (ref 70–99)
Potassium: 2.9 mmol/L — ABNORMAL LOW (ref 3.5–5.1)
Sodium: 136 mmol/L (ref 135–145)

## 2022-02-09 LAB — PREPARE RBC (CROSSMATCH)

## 2022-02-09 LAB — RETICULOCYTES
Immature Retic Fract: 36 % — ABNORMAL HIGH (ref 2.3–15.9)
RBC.: 3.38 MIL/uL — ABNORMAL LOW (ref 3.87–5.11)
Retic Count, Absolute: 76.1 10*3/uL (ref 19.0–186.0)
Retic Ct Pct: 2.3 % (ref 0.4–3.1)

## 2022-02-09 LAB — BPAM RBC
Blood Product Expiration Date: 202402152359
Unit Type and Rh: 7300

## 2022-02-09 LAB — MAGNESIUM: Magnesium: 1.7 mg/dL (ref 1.7–2.4)

## 2022-02-09 LAB — ABO/RH: ABO/RH(D): B POS

## 2022-02-09 LAB — FERRITIN: Ferritin: 20 ng/mL (ref 11–307)

## 2022-02-09 MED ORDER — SODIUM CHLORIDE 0.9 % IV SOLN: Freq: Once | INTRAVENOUS | Status: AC

## 2022-02-09 MED ORDER — SODIUM CHLORIDE 0.9 % IV SOLN
10.0000 mL/h | Freq: Once | INTRAVENOUS | Status: AC
  Administered 2022-02-09: 10 mL/h via INTRAVENOUS

## 2022-02-09 MED ORDER — NORETHINDRONE ACETATE 5 MG PO TABS
5.0000 mg | ORAL_TABLET | Freq: Three times a day (TID) | ORAL | Status: DC
  Administered 2022-02-09 – 2022-02-10 (×3): 5 mg via ORAL
  Filled 2022-02-09 (×5): qty 1

## 2022-02-09 MED ORDER — ACETAMINOPHEN 650 MG RE SUPP: 650.0000 mg | Freq: Four times a day (QID) | RECTAL | Status: DC | PRN

## 2022-02-09 MED ORDER — ONDANSETRON HCL 4 MG/2ML IJ SOLN: 4.0000 mg | Freq: Four times a day (QID) | INTRAMUSCULAR | Status: DC | PRN

## 2022-02-09 MED ORDER — ONDANSETRON HCL 4 MG/2ML IJ SOLN
4.0000 mg | Freq: Once | INTRAMUSCULAR | Status: AC
  Administered 2022-02-09: 4 mg via INTRAVENOUS
  Filled 2022-02-09: qty 2

## 2022-02-09 MED ORDER — NORETHINDRONE ACETATE 5 MG PO TABS
5.0000 mg | ORAL_TABLET | Freq: Three times a day (TID) | ORAL | Status: DC
  Filled 2022-02-09: qty 1

## 2022-02-09 MED ORDER — POTASSIUM CHLORIDE CRYS ER 20 MEQ PO TBCR
40.0000 meq | EXTENDED_RELEASE_TABLET | Freq: Once | ORAL | Status: AC
  Administered 2022-02-09: 40 meq via ORAL
  Filled 2022-02-09: qty 2

## 2022-02-09 MED ORDER — ONDANSETRON HCL 4 MG PO TABS: 4.0000 mg | ORAL_TABLET | Freq: Four times a day (QID) | ORAL | Status: DC | PRN

## 2022-02-09 MED ORDER — BENZONATATE 100 MG PO CAPS
100.0000 mg | ORAL_CAPSULE | Freq: Three times a day (TID) | ORAL | Status: DC | PRN
  Administered 2022-02-10: 100 mg via ORAL
  Filled 2022-02-09 (×2): qty 1

## 2022-02-09 MED ORDER — ALBUTEROL SULFATE (2.5 MG/3ML) 0.083% IN NEBU: 3.0000 mL | INHALATION_SOLUTION | RESPIRATORY_TRACT | Status: DC | PRN

## 2022-02-09 MED ORDER — POLYSACCHARIDE IRON COMPLEX 150 MG PO CAPS
150.0000 mg | ORAL_CAPSULE | Freq: Every day | ORAL | Status: DC
  Administered 2022-02-10: 150 mg via ORAL
  Filled 2022-02-09: qty 1

## 2022-02-09 MED ORDER — ACETAMINOPHEN 325 MG PO TABS
650.0000 mg | ORAL_TABLET | Freq: Four times a day (QID) | ORAL | Status: DC | PRN
  Administered 2022-02-09: 650 mg via ORAL
  Filled 2022-02-09: qty 2

## 2022-02-09 MED ORDER — ADULT MULTIVITAMIN W/MINERALS CH
1.0000 | ORAL_TABLET | Freq: Every day | ORAL | Status: DC
  Administered 2022-02-09 – 2022-02-10 (×2): 1 via ORAL
  Filled 2022-02-09 (×2): qty 1

## 2022-02-09 MED ORDER — ESTROGENS CONJUGATED 25 MG IJ SOLR
25.0000 mg | Freq: Four times a day (QID) | INTRAMUSCULAR | Status: AC
  Administered 2022-02-09 – 2022-02-10 (×4): 25 mg via INTRAVENOUS
  Filled 2022-02-09 (×6): qty 25

## 2022-02-09 MED ORDER — SODIUM CHLORIDE 0.9% FLUSH
3.0000 mL | Freq: Two times a day (BID) | INTRAVENOUS | Status: DC
  Administered 2022-02-09 – 2022-02-10 (×3): 3 mL via INTRAVENOUS

## 2022-02-09 NOTE — Assessment & Plan Note (Signed)
Potentially in the setting of acute illness, however will rule out DM.   - A1c pending - No indication for SSI at this time

## 2022-02-09 NOTE — ED Provider Notes (Signed)
[facility] Provider Note    Event Date/Time   First MD Initiated Contact with Patient 02/09/22 1518     (approximate)   History   Chief Complaint Weakness (Patient presents today with weakness x 2 days; She was recently diagnosed with bronchitis and was feeling tired from that, but then her menstrual cycle started yesterday and she has been bleeding heavily ever since; Patient appears lethargic and her gums are pale; Patient states that she would accept PRBC if needed)   HPI  Alexis Torres is a 52 y.o. female with no significant past medical history who presents to the ED complaining of weakness.  Patient reports that she started her usual menstrual period yesterday but has noticed much heavier bleeding than usual since then.  She states that she has to go to the bathroom to pass a large clot at least every hour, experiences a gush of blood afterwards.  She denies any associated pain, but has begun feeling very lightheaded, weak, fatigued, and cold.  She denies any chest pain or shortness of breath, states she has been told she is anemic in the past but has never required transfusion.  She denies passing anything other than blood, has not had any blood in her stool.      Physical Exam   Triage Vital Signs: ED Triage Vitals [02/09/22 1331]  Enc Vitals Group     BP (!) 97/51     Pulse Rate 86     Resp 14     Temp 98.2 F (36.8 C)     Temp Source Oral     SpO2 97 %     Weight 200 lb (90.7 kg)     Height '5\' 4"'$  (1.626 m)     Head Circumference      Peak Flow      Pain Score 0     Pain Loc      Pain Edu?      Excl. in [place]?     Most recent vital signs: Vitals:   02/09/22 1700 02/09/22 1710  BP: 124/70 120/77  Pulse: 78 76  Resp: 18   Temp: 98.2 F (36.8 C)   SpO2: 99% 100%    Constitutional: Alert and oriented. Eyes: Conjunctivae are normal. Head: Atraumatic. Nose: No congestion/rhinnorhea. Mouth/Throat: Mucous membranes are moist.   Cardiovascular: Normal rate, regular rhythm. Grossly normal heart sounds.  2+ radial pulses bilaterally. Respiratory: Normal respiratory effort.  No retractions. Lungs CTAB. Gastrointestinal: Soft and nontender. No distention. Genitourinary: Pelvic exam with large clot in the vaginal vault, brisk bleeding noted with removal. Musculoskeletal: No lower extremity tenderness nor edema.  Neurologic:  Normal speech and language. No gross focal neurologic deficits are appreciated.    ED Results / Procedures / Treatments   Labs (all labs ordered are listed, but only abnormal results are displayed) Labs Reviewed  CBC WITH DIFFERENTIAL/PLATELET - Abnormal; Notable for the following components:      Result Value   WBC 15.4 (*)    RBC 3.39 (*)    Hemoglobin 6.7 (*)    HCT 23.3 (*)    MCV 68.7 (*)    MCH 19.8 (*)    MCHC 28.8 (*)    RDW 18.3 (*)    Neutro Abs 12.1 (*)    All other components within normal limits  BASIC METABOLIC PANEL - Abnormal; Notable for the following components:   Potassium 2.9 (*)    Glucose, Bld 147 (*)  Calcium 8.5 (*)    All other components within normal limits  RETICULOCYTES - Abnormal; Notable for the following components:   RBC. 3.38 (*)    Immature Retic Fract 36.0 (*)    All other components within normal limits  HCG, QUANTITATIVE, PREGNANCY  MAGNESIUM  IRON AND TIBC  FERRITIN  TYPE AND SCREEN  PREPARE RBC (CROSSMATCH)  ABO/RH  PREPARE RBC (CROSSMATCH)     EKG  ED ECG REPORT I, Blake Divine, the attending physician, personally viewed and interpreted this ECG.   Date: 02/09/2022  EKG Time: 13:34  Rate: 87  Rhythm: normal sinus rhythm  Axis: Normal  Intervals:none  ST&T Change: None  PROCEDURES:  Critical Care performed: Yes, see critical care procedure note(s)  .Critical Care  Performed by: Blake Divine, MD Authorized by: Blake Divine, MD   Critical care provider statement:    Critical care time (minutes):  30    Critical care time was exclusive of:  Separately billable procedures and treating other patients and teaching time   Critical care was necessary to treat or prevent imminent or life-threatening deterioration of the following conditions: Anemia, active bleeding.   Critical care was time spent personally by me on the following activities:  Development of treatment plan with patient or surrogate, discussions with consultants, evaluation of patient's response to treatment, examination of patient, ordering and review of laboratory studies, ordering and review of radiographic studies, ordering and performing treatments and interventions, pulse oximetry, re-evaluation of patient's condition and review of old charts   I assumed direction of critical care for this patient from another provider in my specialty: no     Care discussed with: admitting provider      MEDICATIONS ORDERED IN ED: Medications  conjugated estrogens (PREMARIN) injection 25 mg (has no administration in time range)  ondansetron (ZOFRAN) injection 4 mg (has no administration in time range)  norethindrone (AYGESTIN) tablet 5 mg (has no administration in time range)  0.9 %  sodium chloride infusion (10 mL/hr Intravenous New Bag/Given 02/09/22 1600)  potassium chloride SA (KLOR-CON M) CR tablet 40 mEq (40 mEq Oral Given 02/09/22 1603)  0.9 %  sodium chloride infusion ( Intravenous New Bag/Given 02/09/22 1608)     IMPRESSION / MDM / [place] / ED COURSE  I reviewed the triage vital signs and the nursing notes.                              51 y.o. female with no significant past medical history presents to the ED with weakness, lightheadedness, and fatigue worsening over the past 2 days after she developed significant vaginal bleeding.  Patient's presentation is most consistent with acute presentation with potential threat to life or bodily function.  Differential diagnosis includes, but is not limited to, anemia,  dehydration, electrolyte abnormality, AKI, uterine fibroids, malignancy, dysfunctional uterine bleeding.  Patient ill and pale appearing but in no acute distress with reassuring vital signs.  She has a benign abdominal exam but significant bleeding noted with pelvic exam, labs from triage notable for hemoglobin of 6.2 and we will transfuse 1 unit PRBCs.  Given ongoing bleeding, we will crossmatch additional unit of blood to have available if she becomes unstable.  Additional labs remarkable for hypokalemia with no other electrolyte abnormality or AKI, will add on magnesium level.  She may have a component of chronic anemia exacerbated by bleeding over the past 2 days, will add on  iron studies.  We will further assess with pelvic ultrasound, case discussed with midwife Grandville Silos, who will evaluate the patient.  Patient remains hemodynamically stable and we will hold off on additional transfusion beyond 1 unit PRBCs.  Midwife Grandville Silos recommends IV estrogen in addition to oral Aygestin.  Case discussed with hospitalist for admission.      FINAL CLINICAL IMPRESSION(S) / ED DIAGNOSES   Final diagnoses:  Vaginal bleeding  Anemia, unspecified type     Rx / DC Orders   ED Discharge Orders     None        Note:  This document was prepared using Dragon voice recognition software and may include unintentional dictation errors.   Blake Divine, MD 02/09/22 [phone]

## 2022-02-09 NOTE — ED Notes (Signed)
Assisted Dr Charna Archer with pelvic exam. Pt tolerated procedure.

## 2022-02-09 NOTE — ED Notes (Signed)
Pt signed consent for blood

## 2022-02-09 NOTE — H&P (Signed)
History and Physical    Patient: Alexis Torres XWR:[id number] DOB: [birth year] DOA: 02/09/2022 DOS: the patient was seen and examined on 02/09/2022 PCP: Earnstine Regal, PA-C  Patient coming from: Home  Chief Complaint:  Chief Complaint  Patient presents with   Weakness    Patient presents today with weakness x 2 days; She was recently diagnosed with bronchitis and was feeling tired from that, but then her menstrual cycle started yesterday and she has been bleeding heavily ever since; Patient appears lethargic and her gums are pale; Patient states that she would accept PRBC if needed   HPI: Alexis Torres is a 52 y.o. female with medical history significant of microcytic anemia who presents to the ED with complaints of vaginal bleeding and weakness.  Ms. Chevalier states that she has been experiencing an bronchitis for several days now and on 02/07/2022, she had an ED visit at the urgent care and was started on azithromycin.  She only took 1 dose.  On 02/08/2022, her menstrual cycle began, however it was significantly heavier than usual.  She states that each time she would go to the restroom, she went up large clots followed by gushing of blood.  She denies any previous heavy menstrual cycle such as this.  She states her menstrual cycles are regular.  She endorses severe fatigue, generalized weakness and feeling like she was going to faint.  She denies any shortness of breath, chest pain.  She endorses nausea but denies vomiting.  ED course: On arrival to the ED, patient was initially hypotensive at 97/51 with heart rate of 86.  She was saturating at 97% on room air. Initial work up remarkable for WBC of 15.4, hemoglobin of 6.7, and platelets 338, potassium of 2.9, glucose of 147 and GFR above 60.  Beta hCG negative.  OB/GYN was consulted with recommendation to start IV estrogen and oral progesterone.  1 unit of packed RBCs ordered.  [facility] contacted for admission.  Review of Systems: As  mentioned in the history of present illness. All other systems reviewed and are negative.  No past medical history on file. Past Surgical History:  Procedure Laterality Date   WISDOM TOOTH EXTRACTION     Social History:  reports that she has been smoking cigarettes. She has never used smokeless tobacco. She reports that she does not drink alcohol and does not use drugs.  No Known Allergies  Family History  Problem Relation Age of Onset   Heart disease Father    Cancer Mother        COLON   Seizures Sister        EPILEPSY    Prior to Admission medications   Medication Sig Start Date End Date Taking? Authorizing Provider  albuterol (PROVENTIL HFA;VENTOLIN HFA) 108 (90 Base) MCG/ACT inhaler Inhale 2 puffs into the lungs every 4 (four) hours as needed for wheezing or shortness of breath (or coughing). 19/14/78   Delora Fuel, MD  azithromycin [facility]) 250 MG tablet Take 2 tablets on day 1, then 1 tablet daily on days 2 through 5 02/07/22 02/12/22  Mar Daring, PA-C  benzonatate (TESSALON) 100 MG capsule Take 1 capsule (100 mg total) by mouth 3 (three) times daily as needed. 02/07/22   Mar Daring, PA-C  Budesonide (PULMICORT FLEXHALER) 90 MCG/ACT inhaler Inhale 1 puff into the lungs 2 (two) times daily. 29/56/21   Delora Fuel, MD  naproxen sodium (ANAPROX) 220 MG tablet Take 220 mg by mouth 2 (two) times daily as  needed (pain).    [provider]   Physical Exam: Vitals:   02/09/22 1700 02/09/22 1700 02/09/22 1710 02/09/22 1745  BP: 124/70 124/70 120/77 131/70  Pulse: 81 78 76 81  Resp:  18    Temp:  98.2 F (36.8 C)    TempSrc:  Oral    SpO2: 98% 99% 100% 99%  Weight:      Height:       Physical Exam Vitals and nursing note reviewed.  Constitutional:      General: She is not in acute distress.    Appearance: She is obese. She is not toxic-appearing.  HENT:     Head: Normocephalic and atraumatic.     Mouth/Throat:     Mouth: Mucous membranes are  moist.     Pharynx: Oropharynx is clear.  Eyes:     Pupils: Pupils are equal, round, and reactive to light.  Cardiovascular:     Rate and Rhythm: Normal rate and regular rhythm.     Heart sounds: No murmur heard.    No gallop.  Pulmonary:     Effort: Pulmonary effort is normal. No respiratory distress.     Breath sounds: Normal breath sounds. No wheezing, rhonchi or rales.  Abdominal:     General: Bowel sounds are normal. There is no distension.     Palpations: Abdomen is soft.     Tenderness: There is no abdominal tenderness. There is no guarding.  Musculoskeletal:     Right lower leg: No edema.     Left lower leg: No edema.  Skin:    General: Skin is warm.     Coloration: Skin is pale.  Neurological:     General: No focal deficit present.     Mental Status: She is alert and oriented to person, place, and time. Mental status is at baseline.  Psychiatric:        Mood and Affect: Mood normal.        Behavior: Behavior normal.    Data Reviewed: CBC with WBC of 15.4, hemoglobin of 6.7, MCV of 68.7 and platelets of 338 BMP with sodium of 136, potassium of 2.9, bicarb of 26, glucose of 147, BUN of 9 and creatinine of 0.80 GFR above 60 Retic count percentage of 2.3% with a calculated absolute reticulocyte count of 1.3 with reticulocyte index of 0.63 Beta hCG negative  EKG reviewed.  Sinus rhythm with a rate of 87.  No acute ST or T wave changes concerning for acute ischemia.  US PELVIC COMPLETE WITH TRANSVAGINAL  Result Date: 02/09/2022 CLINICAL DATA:  Vaginal bleeding EXAM: TRANSABDOMINAL AND TRANSVAGINAL ULTRASOUND OF PELVIS TECHNIQUE: Both transabdominal and transvaginal ultrasound examinations of the pelvis were performed. Transabdominal technique was performed for global imaging of the pelvis including uterus, ovaries, adnexal regions, and pelvic cul-de-sac. It was necessary to proceed with endovaginal exam following the transabdominal exam to visualize the ovaries. COMPARISON:   02/09/2009 FINDINGS: Uterus Measurements: 15.7 x 12.9 x 11.7 cm. = volume: 1139 mL. Multiple uterine fibroids are noted. The largest of these measures 7.5 cm in greatest dimension. Endometrium Thickness: 6.8 mm.  No focal abnormality visualized. Right ovary Not visualized Left ovary Not visualized Other findings No abnormal free fluid. IMPRESSION: Multiple uterine fibroids similar to that seen on prior exam. No acute abnormality noted. Electronically Signed   By: Inez Catalina M.D.   On: 02/09/2022 17:45    There are no new results to review at this time.  Assessment and Plan:  *  Symptomatic anemia Patient presenting with symptomatic anemia in the setting of menorrhagia.   - OB/GYN consulted; appreciate their recommendations - 1 unit of packed RBCs ordered - Posttransfusion CBC ordered - IV estrogen every 6 hours for 24 hours - Oral progesterone daily - Repeat transfusion for hemoglobin less than 7 - Iron panel pending  Leukocytosis Likely reactive, low suspicion for infection.   -Repeat CBC in a.m.  Hypokalemia Likely due to poor p.o. intake over the last 1 week.  - S/p 40 mEq of Kdur - Repeat BMP in the AM  Hyperglycemia Potentially in the setting of acute illness, however will rule out DM.   - A1c pending - No indication for SSI at this time  Advance Care Planning:   Code Status: Full Code Verified by patient.   Consults: OB/GYN  Family Communication: Patients daughter updated at bedside.   Severity of Illness: The appropriate patient status for this patient is OBSERVATION. Observation status is judged to be reasonable and necessary in order to provide the required intensity of service to ensure the patient's safety. The patient's presenting symptoms, physical exam findings, and initial radiographic and laboratory data in the context of their medical condition is felt to place them at decreased risk for further clinical deterioration. Furthermore, it is anticipated that the  patient will be medically stable for discharge from the hospital within 2 midnights of admission.   Author: Jose Persia, MD 02/09/2022 6:59 PM  For on call review [email or web address].

## 2022-02-09 NOTE — Assessment & Plan Note (Addendum)
Patient presenting with symptomatic anemia in the setting of menorrhagia.   - OB/GYN consulted; appreciate their recommendations - 1 unit of packed RBCs ordered - Posttransfusion CBC ordered - IV estrogen every 6 hours for 24 hours - Oral progesterone daily - Repeat transfusion for hemoglobin less than 7 - Iron panel pending

## 2022-02-09 NOTE — ED Notes (Signed)
Blood transfusion started    family with pt.  Pt alert.

## 2022-02-09 NOTE — ED Notes (Signed)
Admitting md in with pt and family.  Pt receiving prbc's.

## 2022-02-09 NOTE — ED Notes (Signed)
Pt has cold sx and vaginal bleeding since yesterday with abd cramping.  No n/v/d   pt states passing blood clots.  Denies urinary sx.  No fever.  States I feel weak.  Iv in place  pt alert  family with pt.

## 2022-02-09 NOTE — Assessment & Plan Note (Signed)
Likely due to poor p.o. intake over the last 1 week.  - S/p 40 mEq of Kdur - Repeat BMP in the AM

## 2022-02-09 NOTE — ED Triage Notes (Signed)
Patient presents today with weakness x 2 days; She was recently diagnosed with bronchitis and was feeling tired from that, but then her menstrual cycle started yesterday and she has been bleeding heavily ever since; Patient appears lethargic and her gums are pale; Patient states that she would accept PRBC if needed

## 2022-02-09 NOTE — Assessment & Plan Note (Signed)
Likely reactive, low suspicion for infection.   -Repeat CBC in a.m.

## 2022-02-09 NOTE — ED Notes (Signed)
Report off to alexys rn cpod nurse  pt moved to cpod 30

## 2022-02-09 NOTE — ED Notes (Signed)
This Rn attempted to call report for the pt to the mother baby unit, Loma Sousa, RN stated the nurse assigned to this pt was in another room and would call back for report. This RN, gave Loma Sousa, RN his call back number for report.

## 2022-02-10 ENCOUNTER — Encounter: Payer: Self-pay | Admitting: Internal Medicine

## 2022-02-10 ENCOUNTER — Telehealth: Payer: Self-pay

## 2022-02-10 LAB — CBC
HCT: 21.5 % — ABNORMAL LOW (ref 36.0–46.0)
HCT: 23.6 % — ABNORMAL LOW (ref 36.0–46.0)
HCT: 22.7 % — ABNORMAL LOW (ref 36.0–46.0)
Hemoglobin: 6.6 g/dL — ABNORMAL LOW (ref 12.0–15.0)
Hemoglobin: 7.6 g/dL — ABNORMAL LOW (ref 12.0–15.0)
Hemoglobin: 7.5 g/dL — ABNORMAL LOW (ref 12.0–15.0)
MCH: 21.4 pg — ABNORMAL LOW (ref 26.0–34.0)
MCH: 23 pg — ABNORMAL LOW (ref 26.0–34.0)
MCH: 23.4 pg — ABNORMAL LOW (ref 26.0–34.0)
MCHC: 30.7 g/dL (ref 30.0–36.0)
MCHC: 32.2 g/dL (ref 30.0–36.0)
MCHC: 33 g/dL (ref 30.0–36.0)
MCV: 69.6 fL — ABNORMAL LOW (ref 80.0–100.0)
MCV: 71.3 fL — ABNORMAL LOW (ref 80.0–100.0)
MCV: 70.7 fL — ABNORMAL LOW (ref 80.0–100.0)
Platelets: 288 K/uL (ref 150–400)
Platelets: 281 10*3/uL (ref 150–400)
Platelets: 340 10*3/uL (ref 150–400)
RBC: 3.09 MIL/uL — ABNORMAL LOW (ref 3.87–5.11)
RBC: 3.31 MIL/uL — ABNORMAL LOW (ref 3.87–5.11)
RBC: 3.21 MIL/uL — ABNORMAL LOW (ref 3.87–5.11)
RDW: 19.5 % — ABNORMAL HIGH (ref 11.5–15.5)
RDW: 20.3 % — ABNORMAL HIGH (ref 11.5–15.5)
RDW: 20.2 % — ABNORMAL HIGH (ref 11.5–15.5)
WBC: 16.1 K/uL — ABNORMAL HIGH (ref 4.0–10.5)
WBC: 17.1 10*3/uL — ABNORMAL HIGH (ref 4.0–10.5)
WBC: 21.6 10*3/uL — ABNORMAL HIGH (ref 4.0–10.5)
nRBC: 0 % (ref 0.0–0.2)
nRBC: 0 % (ref 0.0–0.2)
nRBC: 0 % (ref 0.0–0.2)

## 2022-02-10 LAB — BASIC METABOLIC PANEL
Anion gap: 8 (ref 5–15)
BUN: 12 mg/dL (ref 6–20)
CO2: 24 mmol/L (ref 22–32)
Calcium: 8.3 mg/dL — ABNORMAL LOW (ref 8.9–10.3)
Chloride: 103 mmol/L (ref 98–111)
Creatinine, Ser: 0.7 mg/dL (ref 0.44–1.00)
GFR, Estimated: >60 mL/min (ref >60)
Glucose, Bld: 153 mg/dL — ABNORMAL HIGH (ref 70–99)
Potassium: 3 mmol/L — ABNORMAL LOW (ref 3.5–5.1)
Sodium: 135 mmol/L (ref 135–145)

## 2022-02-10 LAB — BPAM RBC
Blood Product Expiration Date: 202402152359
ISSUE DATE / TIME: 202402010112
Unit Type and Rh: 7300

## 2022-02-10 LAB — PREPARE RBC (CROSSMATCH)

## 2022-02-10 LAB — TYPE AND SCREEN: Unit division: 0

## 2022-02-10 LAB — HIV ANTIBODY (ROUTINE TESTING W REFLEX): HIV Screen 4th Generation wRfx: NONREACTIVE

## 2022-02-10 MED ORDER — POTASSIUM CHLORIDE CRYS ER 20 MEQ PO TBCR
40.0000 meq | EXTENDED_RELEASE_TABLET | Freq: Once | ORAL | Status: AC
  Administered 2022-02-10: 40 meq via ORAL
  Filled 2022-02-10: qty 2

## 2022-02-10 MED ORDER — SODIUM CHLORIDE (PF) 0.9 % IJ SOLN
INTRAMUSCULAR | Status: AC
  Administered 2022-02-10: 10 mL
  Filled 2022-02-10: qty 10

## 2022-02-10 MED ORDER — POLYSACCHARIDE IRON COMPLEX 150 MG PO CAPS: 150.0000 mg | ORAL_CAPSULE | Freq: Every day | ORAL | 0 refills | Status: DC

## 2022-02-10 MED ORDER — ADULT MULTIVITAMIN W/MINERALS CH: 1.0000 | ORAL_TABLET | Freq: Every day | ORAL | 0 refills | Status: AC

## 2022-02-10 MED ORDER — SODIUM CHLORIDE 0.9% IV SOLUTION: Freq: Once | INTRAVENOUS | Status: AC

## 2022-02-10 MED ORDER — NORETHINDRONE ACETATE 5 MG PO TABS: 5.0000 mg | ORAL_TABLET | Freq: Three times a day (TID) | ORAL | 0 refills | Status: DC

## 2022-02-10 NOTE — Progress Notes (Signed)
Pt discharged home.  Discharge instructions, prescriptions and follow up appointment given to and reviewed with pt.  Pt verbalized understanding.  Escorted by auxillary. 

## 2022-02-10 NOTE — Discharge Summary (Signed)
Discharge Summary  Alexis Torres:[id number] DOB: 1970-02-28  PCP: Earnstine Regal, PA-C  Admit date: 02/09/2022 Discharge date: 02/10/2022  Recommendations for Outpatient Follow-up:  Please follow up with your PCP with CBC and BMP in 1-2 weeks Follow-up with gynecology as an outpatient within 1 week.  Patient has already called the clinic to make an appointment.  Discharge Diagnoses:  Active Hospital Problems   Diagnosis Date Noted   Symptomatic anemia 02/09/2022    Priority: 1.   Leukocytosis 02/09/2022    Priority: 2.   Hypokalemia 02/09/2022    Priority: 3.   Hyperglycemia 02/09/2022    Priority: 4.    Resolved Hospital Problems  No resolved problems to display.   Discharge Condition: Stable   Diet recommendation: Diet Orders (From admission, onward)     Start     Ordered   02/09/22 1757  Diet regular Room service appropriate? Yes; Fluid consistency: Thin  Diet effective now       Question Answer Comment  Room service appropriate? Yes   Fluid consistency: Thin      02/09/22 1757           HPI and Brief Hospital Course:  This is a pleasant 52 year old female with a history of microcytic anemia who presented to the ED with complaints of vaginal bleeding and weakness, found to have a hemoglobin of 6.7.  Per midwife recommendations, the patient was started on IV estrogen and oral progesterone.  She was transfused 2 units of blood.  After 2 units, this morning her hemoglobin is 7.6.  Recheck in the afternoon is 7.5.  Patient says that after admission and initiation of IV estrogen, her bleeding has stopped.  This afternoon, patient's nurse reports that acute bleeding has completely stopped, she did pass 1 small dark clot.  Patient remained stable and asymptomatic, with stable hemoglobin and is ready for discharge home from the hospital.  She received 24 hours of IV estrogen, will discharge home on oral iron supplementation, and oral progesterone 3 times a day for the  next 10 days.  Patient was instructed to follow-up closely with gynecology, she has already contacted them to make an appointment.  Procedures: Transfused 2 units of blood  Consultations: EDP discussed with midwife over the phone.  Discharge details, plan of care and follow up instructions were discussed with patient and any available family or care providers. Patient and family are in agreement with discharge from the hospital today and all questions were answered to their satisfaction.  Discharge Exam: BP (!) 146/73 (BP Location: Right Arm)   Pulse 76   Temp 98.2 F (36.8 C) (Oral)   Resp 20   Ht '5\' 4"'$  (1.626 m)   Wt 90.7 kg   LMP 02/07/2022 (Exact Date)   SpO2 100%   BMI 34.33 kg/m  General:  Alert, oriented, calm, in no acute distress, resting comfortably on room air when seen in her room this morning Eyes: EOMI, clear sclerea Neck: supple, no masses, trachea mildline  Cardiovascular: RRR, no murmurs or rubs, no peripheral edema  Respiratory: clear to auscultation bilaterally, no wheezes, no crackles  Abdomen: soft, nontender, nondistended, normal bowel tones heard  Skin: dry, no rashes  Musculoskeletal: no joint effusions, normal range of motion  Psychiatric: appropriate affect, normal speech  Neurologic: extraocular muscles intact, clear speech, moving all extremities with intact sensorium   Discharge Instructions You were cared for by a hospitalist during your hospital stay. If you have any questions about your discharge  medications or the care you received while you were in the hospital after you are discharged, you can call the unit and asked to speak with the hospitalist on call if the hospitalist that took care of you is not available. Once you are discharged, your primary care physician will handle any further medical issues. Please note that NO REFILLS for any discharge medications will be authorized once you are discharged, as it is imperative that you return to your  primary care physician (or establish a relationship with a primary care physician if you do not have one) for your aftercare needs so that they can reassess your need for medications and monitor your lab values.   Allergies as of 02/10/2022   No Known Allergies      Medication List     STOP taking these medications    albuterol 108 (90 Base) MCG/ACT inhaler Commonly known as: VENTOLIN HFA   azithromycin 250 MG tablet Commonly known as: ZITHROMAX   Budesonide 90 MCG/ACT inhaler Commonly known as: Pulmicort Flexhaler   naproxen sodium 220 MG tablet Commonly known as: ALEVE       TAKE these medications    benzonatate 100 MG capsule Commonly known as: TESSALON Take 1 capsule (100 mg total) by mouth 3 (three) times daily as needed.   iron polysaccharides 150 MG capsule Commonly known as: NIFEREX Take 1 capsule (150 mg total) by mouth daily. Start taking on: February 11, 2022   multivitamin with minerals Tabs tablet Take 1 tablet by mouth daily. Start taking on: February 11, 2022   norethindrone 5 MG tablet Commonly known as: AYGESTIN Take 1 tablet (5 mg total) by mouth 3 (three) times daily for 10 days.       No Known Allergies  Follow-up Information     Earnstine Regal, PA-C Follow up in 1 week(s).   Specialty: Obstetrics and Gynecology Contact information: [address] [place] [address] [phone]                 The results of significant diagnostics from this hospitalization (including imaging, microbiology, ancillary and laboratory) are listed below for reference.    Significant Diagnostic Studies: US PELVIC COMPLETE WITH TRANSVAGINAL  Result Date: 02/09/2022 CLINICAL DATA:  Vaginal bleeding EXAM: TRANSABDOMINAL AND TRANSVAGINAL ULTRASOUND OF PELVIS TECHNIQUE: Both transabdominal and transvaginal ultrasound examinations of the pelvis were performed. Transabdominal technique was performed for global imaging of the pelvis  including uterus, ovaries, adnexal regions, and pelvic cul-de-sac. It was necessary to proceed with endovaginal exam following the transabdominal exam to visualize the ovaries. COMPARISON:  02/09/2009 FINDINGS: Uterus Measurements: 15.7 x 12.9 x 11.7 cm. = volume: 1139 mL. Multiple uterine fibroids are noted. The largest of these measures 7.5 cm in greatest dimension. Endometrium Thickness: 6.8 mm.  No focal abnormality visualized. Right ovary Not visualized Left ovary Not visualized Other findings No abnormal free fluid. IMPRESSION: Multiple uterine fibroids similar to that seen on prior exam. No acute abnormality noted. Electronically Signed   By: Inez Catalina M.D.   On: 02/09/2022 17:45    Microbiology: No results found for this or any previous visit (from the past 240 hour(s)).   Labs: Basic Metabolic Panel: Recent Labs  Lab 02/09/22 1335 02/09/22 1605 02/10/22 0633  NA 136  --  135  K 2.9*  --  3.0*  CL 101  --  103  CO2 26  --  24  GLUCOSE 147*  --  153*  BUN 9  --  12  CREATININE 0.80  --  0.70  CALCIUM 8.5*  --  8.3*  MG  --  1.7  --    Liver Function Tests: No results for input(s): "AST", "ALT", "ALKPHOS", "BILITOT", "PROT", "ALBUMIN" in the last 168 hours. No results for input(s): "LIPASE", "AMYLASE" in the last 168 hours. No results for input(s): "AMMONIA" in the last 168 hours. CBC: Recent Labs  Lab 02/09/22 1335 02/09/22 2248 02/10/22 0633 02/10/22 1327  WBC 15.4* 16.1* 17.1* 21.6*  NEUTROABS 12.1*  --   --   --   HGB 6.7* 6.6* 7.6* 7.5*  HCT 23.3* 21.5* 23.6* 22.7*  MCV 68.7* 69.6* 71.3* 70.7*  PLT 338 288 281 340   Cardiac Enzymes: No results for input(s): "CKTOTAL", "CKMB", "CKMBINDEX", "TROPONINI" in the last 168 hours. BNP: BNP (last 3 results) No results for input(s): "BNP" in the last 8760 hours.  ProBNP (last 3 results) No results for input(s): "PROBNP" in the last 8760 hours.  CBG: No results for input(s): "GLUCAP" in the last 168 hours.  Time  spent: > 30 minutes were spent in preparing this discharge including medication reconciliation, counseling, and coordination of care.  Signed:  Alanah Sakuma Neva Seat, MD  [facility] 02/10/2022, 2:06 PM

## 2022-02-10 NOTE — Telephone Encounter (Signed)
Attempted to reach patient regarding message left on vm . Left voicemail for patient to call office or send mychart message with details regarding appointment needs.

## 2022-02-11 LAB — TYPE AND SCREEN
ABO/RH(D): B POS
Antibody Screen: NEGATIVE
Unit division: 0

## 2022-02-11 LAB — HEMOGLOBIN A1C
Hgb A1c MFr Bld: 5.3 % (ref 4.8–5.6)
Mean Plasma Glucose: 105 mg/dL

## 2022-02-11 LAB — BPAM RBC: ISSUE DATE / TIME: 202401311633

## 2022-02-14 ENCOUNTER — Ambulatory Visit: Payer: 59 | Admitting: Obstetrics & Gynecology

## 2022-02-14 ENCOUNTER — Other Ambulatory Visit (HOSPITAL_COMMUNITY)
Admission: RE | Admit: 2022-02-14 | Discharge: 2022-02-14 | Disposition: A | Discharge status: Home / Self Care | Payer: 59 | Source: Ambulatory Visit | Attending: Obstetrics & Gynecology | Admitting: Obstetrics & Gynecology

## 2022-02-14 ENCOUNTER — Encounter: Payer: Self-pay | Admitting: Obstetrics & Gynecology

## 2022-02-14 ENCOUNTER — Other Ambulatory Visit (HOSPITAL_COMMUNITY)
Admission: RE | Admit: 2022-02-14 | Discharge: 2022-02-14 | Disposition: A | Discharge status: Home / Self Care | Payer: 59 | Source: Ambulatory Visit

## 2022-02-14 VITALS — BP 148/80 | Ht 64.0 in | Wt 209.0 lb

## 2022-02-14 DIAGNOSIS — Z1231 Encounter for screening mammogram for malignant neoplasm of breast: Secondary | ICD-10-CM

## 2022-02-14 DIAGNOSIS — Z124 Encounter for screening for malignant neoplasm of cervix: Secondary | ICD-10-CM

## 2022-02-14 DIAGNOSIS — N920 Excessive and frequent menstruation with regular cycle: Secondary | ICD-10-CM

## 2022-02-14 DIAGNOSIS — D649 Anemia, unspecified: Secondary | ICD-10-CM

## 2022-02-14 DIAGNOSIS — D259 Leiomyoma of uterus, unspecified: Secondary | ICD-10-CM

## 2022-02-14 DIAGNOSIS — D219 Benign neoplasm of connective and other soft tissue, unspecified: Secondary | ICD-10-CM

## 2022-02-14 MED ORDER — MEGESTROL ACETATE 40 MG PO TABS: 40.0000 mg | ORAL_TABLET | Freq: Two times a day (BID) | ORAL | 5 refills | Status: DC

## 2022-02-14 NOTE — Progress Notes (Signed)
   Established Patient Office Visit  Subjective   Patient ID: Alexis Torres, female    DOB: [birth year]  Age: 52 y.o. MRN: [id number]  Chief Complaint  Patient presents with   Follow-up    HPI    52 yo single P74 (32 yo child) here as a new patient for follow up after hospital admission 02/09/2022 for a day for symptomatic anemia. She reports normal monthly periods that last 7 days. Her period last week was heavier than usual and she felt dizzy. She went to the ER and her hbg was 6.7. She was given IV premarin and oral progesterone. Her bleeding has stopped. She was given 2 units of PRBCs and her hbg increased to 7.5. She is still fatigued. She was prescribed daily oral iron (QD). An ultrasound done at the hospital showed multiple fibroids and a 6.8 mm endometrium.  She has been abstinent for about a year.  She has not seen a gyn/had a pap smear since 2013. Objective:     BP (!) 148/80   Ht '5\' 4"'$  (1.626 m)   Wt 209 lb (94.8 kg)   LMP 02/07/2022 (Exact Date)   BMI 35.87 kg/m   Physical Exam   Well nourished, well hydrated Black female, no apparent distress   Consent signed, time out done Cervix prepped with betadine and sprayed with Hurricaine spray. I then grasped with a single tooth tenaculum. Uterus sounded to 14 Pipelle used for 2 passes with a very large amount of tissue obtained. She tolerated the procedure well.    Assessment & Plan:   Problem List Items Addressed This Visit     Symptomatic anemia   Relevant Orders   Ambulatory referral to Hematology / Oncology   Other Visit Diagnoses     Menorrhagia with regular cycle    -  Primary   Fibroids       Screening for cervical cancer       Relevant Orders   Cytology - PAP   Encounter for screening mammogram for malignant neoplasm of breast       Relevant Orders   MM DIGITAL SCREENING BILATERAL      Await pathology.  She will come back and see one of the surgeons here as she very much wants definitive  treatment (hysterectomy) for her fibroids. In the interim, she will be referred for an iron infusion. Until then, I have rec'd that she increase her oral iron to BID. I will give her megace 40 mg BID to hopefully induce amenorrhea. We discussed other options such as mirena, depo provera, and depot Lupron. At this time she declines these options.   Emily Filbert, MD

## 2022-02-15 ENCOUNTER — Encounter: Payer: Self-pay | Admitting: Obstetrics & Gynecology

## 2022-02-15 LAB — TSH+FREE T4
Free T4: 1.35 ng/dL (ref 0.82–1.77)
TSH: 0.993 u[IU]/mL (ref 0.450–4.500)

## 2022-02-15 LAB — SURGICAL PATHOLOGY

## 2022-02-16 ENCOUNTER — Inpatient Hospital Stay: Payer: 59

## 2022-02-16 ENCOUNTER — Inpatient Hospital Stay: Payer: 59 | Attending: Internal Medicine | Admitting: Internal Medicine

## 2022-02-16 ENCOUNTER — Encounter: Payer: Self-pay | Admitting: Internal Medicine

## 2022-02-16 VITALS — BP 161/79 | HR 97 | Temp 97.8°F | Resp 18 | Ht 66.0 in | Wt 214.8 lb

## 2022-02-16 DIAGNOSIS — Z87891 Personal history of nicotine dependence: Secondary | ICD-10-CM

## 2022-02-16 DIAGNOSIS — Z820 Family history of epilepsy and other diseases of the nervous system: Secondary | ICD-10-CM

## 2022-02-16 DIAGNOSIS — R059 Cough, unspecified: Secondary | ICD-10-CM | POA: Diagnosis not present

## 2022-02-16 DIAGNOSIS — Z800 Family history of malignant neoplasm of digestive organs: Secondary | ICD-10-CM | POA: Diagnosis not present

## 2022-02-16 DIAGNOSIS — D509 Iron deficiency anemia, unspecified: Secondary | ICD-10-CM

## 2022-02-16 DIAGNOSIS — T380X5A Adverse effect of glucocorticoids and synthetic analogues, initial encounter: Secondary | ICD-10-CM | POA: Diagnosis not present

## 2022-02-16 DIAGNOSIS — Z79899 Other long term (current) drug therapy: Secondary | ICD-10-CM | POA: Diagnosis not present

## 2022-02-16 DIAGNOSIS — Z8249 Family history of ischemic heart disease and other diseases of the circulatory system: Secondary | ICD-10-CM | POA: Diagnosis not present

## 2022-02-16 DIAGNOSIS — D72829 Elevated white blood cell count, unspecified: Secondary | ICD-10-CM | POA: Diagnosis not present

## 2022-02-16 DIAGNOSIS — D649 Anemia, unspecified: Secondary | ICD-10-CM

## 2022-02-16 DIAGNOSIS — Z5941 Food insecurity: Secondary | ICD-10-CM | POA: Insufficient documentation

## 2022-02-16 DIAGNOSIS — D259 Leiomyoma of uterus, unspecified: Secondary | ICD-10-CM

## 2022-02-16 DIAGNOSIS — Z801 Family history of malignant neoplasm of trachea, bronchus and lung: Secondary | ICD-10-CM | POA: Diagnosis not present

## 2022-02-16 DIAGNOSIS — R5383 Other fatigue: Secondary | ICD-10-CM | POA: Diagnosis not present

## 2022-02-16 DIAGNOSIS — E876 Hypokalemia: Secondary | ICD-10-CM | POA: Diagnosis not present

## 2022-02-16 DIAGNOSIS — J40 Bronchitis, not specified as acute or chronic: Secondary | ICD-10-CM | POA: Insufficient documentation

## 2022-02-16 LAB — CYTOLOGY - PAP
Adequacy: ABSENT
Comment: NEGATIVE
Diagnosis: NEGATIVE
High risk HPV: NEGATIVE

## 2022-02-16 NOTE — Progress Notes (Unsigned)
[facility] CONSULT NOTE  Patient Care Team: Earnstine Regal, PA-C as PCP - General (Obstetrics and Gynecology)  CHIEF COMPLAINTS/PURPOSE OF CONSULTATION: ANEMIA  HEMATOLOGY HISTORY  # ANEMIA[Hb; MCV-platelets- WBC; Iron sat; ferritin;  GFR- CT/US- ;  EGD/colonoscopy-NONE  HISTORY OF PRESENTING ILLNESS:  Alexis Torres 52 y.o.  female pleasant patient was been referred to [place] for further evaluation of anemia.  Patient has a longstanding history of menorrhagia which is attributed to her fibroids. Saw a OB GYN, pt wants to pursue a hysterectomy.  The interim patient was recently seen in the emergency room for severe anemia.  Hemoglobin between 6-7.  Since ED visit no vaginal bleeding. No pain. Pt states she is here for an iron infusion   Blood in stools: none Blood in urine: none Difficulty swallowing:none Change of bowel movement/constipation: just started few days ago.  Prior blood transfusion: yes, in last 1 week.  Prior history of blood loss: none Liver disease: none Alcohol: none Bariatric surgery:none  Vaginal bleeding: heavy Prior evaluation with hematology:none Prior bone marrow biopsy: none Prior IV iron infusions: none   Review of Systems  Constitutional:  Positive for malaise/fatigue. Negative for chills, diaphoresis, fever and weight loss.  HENT:  Negative for nosebleeds and sore throat.   Eyes:  Negative for double vision.  Respiratory:  Positive for cough. Negative for hemoptysis, sputum production, shortness of breath and wheezing.   Cardiovascular:  Negative for chest pain, palpitations, orthopnea and leg swelling.  Gastrointestinal:  Negative for abdominal pain, blood in stool, constipation, diarrhea, heartburn, melena, nausea and vomiting.  Genitourinary:  Negative for dysuria, frequency and urgency.  Musculoskeletal:  Negative for back pain and joint pain.  Skin: Negative.  Negative for itching and rash.  Neurological:  Negative for  dizziness, tingling, focal weakness, weakness and headaches.  Endo/Heme/Allergies:  Does not bruise/bleed easily.  Psychiatric/Behavioral:  Negative for depression. The patient is not nervous/anxious and does not have insomnia.      MEDICAL HISTORY:  Past Medical History:  Diagnosis Date   Anemia     SURGICAL HISTORY: Past Surgical History:  Procedure Laterality Date   WISDOM TOOTH EXTRACTION      SOCIAL HISTORY: Social History   Socioeconomic History   Marital status: Single    Spouse name: Not on file   Number of children: Not on file   Years of education: Not on file   Highest education level: Not on file  Occupational History   Not on file  Tobacco Use   Smoking status: Former    Types: Cigarettes   Smokeless tobacco: Never   Tobacco comments:    QUIT IN Day.  Vaping Use   Vaping Use: Never used  Substance and Sexual Activity   Alcohol use: No   Drug use: No   Sexual activity: Not Currently    Birth control/protection: Abstinence  Other Topics Concern   Not on file  Social History Narrative   Not on file   Social Determinants of Health   Financial Resource Strain: Not on file  Food Insecurity: Food Insecurity Present (02/16/2022)   Hunger Vital Sign    Worried About Running Out of Food in the Last Year: Sometimes true    Ran Out of Food in the Last Year: Never true  Transportation Needs: No Transportation Needs (02/16/2022)   PRAPARE - Hydrologist (Medical): No    Lack of Transportation (Non-Medical): No  Physical Activity: Not on file  Stress: Not on file  Social Connections: Not on file  Intimate Partner Violence: Not At Risk (02/16/2022)   Humiliation, Afraid, Rape, and Kick questionnaire    Fear of Current or Ex-Partner: No    Emotionally Abused: No    Physically Abused: No    Sexually Abused: No    FAMILY HISTORY: Family History  Problem Relation Age of Onset   Cancer Mother        COLON   Stomach cancer Mother     Heart disease Father    Lung cancer Father    Seizures Sister        EPILEPSY    ALLERGIES:  has No Known Allergies.  MEDICATIONS:  Current Outpatient Medications  Medication Sig Dispense Refill   benzonatate (TESSALON) 100 MG capsule Take 1 capsule (100 mg total) by mouth 3 (three) times daily as needed. 30 capsule 0   iron polysaccharides (NIFEREX) 150 MG capsule Take 1 capsule (150 mg total) by mouth daily. 30 capsule 0   megestrol (MEGACE) 40 MG tablet Take 1 tablet (40 mg total) by mouth 2 (two) times daily. 60 tablet 5   Multiple Vitamin (MULTIVITAMIN WITH MINERALS) TABS tablet Take 1 tablet by mouth daily. 30 tablet 0   No current facility-administered medications for this visit.     Marland Kitchen  PHYSICAL EXAMINATION:   Vitals:   02/16/22 1355  BP: (!) 161/79  Pulse: 97  Resp: 18  Temp: 97.8 F (36.6 C)  SpO2: 100%   Filed Weights   02/16/22 1355  Weight: 214 lb 12.8 oz (97.4 kg)    Physical Exam Vitals and nursing note reviewed.  HENT:     Head: Normocephalic and atraumatic.     Mouth/Throat:     Pharynx: Oropharynx is clear.  Eyes:     Extraocular Movements: Extraocular movements intact.     Pupils: Pupils are equal, round, and reactive to light.  Cardiovascular:     Rate and Rhythm: Normal rate and regular rhythm.  Pulmonary:     Comments: Decreased breath sounds bilaterally.  Abdominal:     Palpations: Abdomen is soft.  Musculoskeletal:        General: Normal range of motion.     Cervical back: Normal range of motion.  Skin:    General: Skin is warm.  Neurological:     General: No focal deficit present.     Mental Status: She is alert and oriented to person, place, and time.  Psychiatric:        Behavior: Behavior normal.        Judgment: Judgment normal.      LABORATORY DATA:  I have reviewed the data as listed Lab Results  Component Value Date   WBC 21.6 (H) 02/10/2022   HGB 7.5 (L) 02/10/2022   HCT 22.7 (L) 02/10/2022   MCV 70.7 (L)  02/10/2022   PLT 340 02/10/2022   Recent Labs    02/09/22 1335 02/10/22 0633  NA 136 135  K 2.9* 3.0*  CL 101 103  CO2 26 24  GLUCOSE 147* 153*  BUN 9 12  CREATININE 0.80 0.70  CALCIUM 8.5* 8.3*  GFRNONAA >60 >60     US PELVIC COMPLETE WITH TRANSVAGINAL  Result Date: 02/09/2022 CLINICAL DATA:  Vaginal bleeding EXAM: TRANSABDOMINAL AND TRANSVAGINAL ULTRASOUND OF PELVIS TECHNIQUE: Both transabdominal and transvaginal ultrasound examinations of the pelvis were performed. Transabdominal technique was performed for global imaging of the pelvis including uterus, ovaries, adnexal regions, and pelvic cul-de-sac. It  was necessary to proceed with endovaginal exam following the transabdominal exam to visualize the ovaries. COMPARISON:  02/09/2009 FINDINGS: Uterus Measurements: 15.7 x 12.9 x 11.7 cm. = volume: 1139 mL. Multiple uterine fibroids are noted. The largest of these measures 7.5 cm in greatest dimension. Endometrium Thickness: 6.8 mm.  No focal abnormality visualized. Right ovary Not visualized Left ovary Not visualized Other findings No abnormal free fluid. IMPRESSION: Multiple uterine fibroids similar to that seen on prior exam. No acute abnormality noted. Electronically Signed   By: Inez Catalina M.D.   On: 02/09/2022 17:45    ASSESSMENT & PLAN:   Symptomatic anemia # Anemia-Symptomatic.  Likely due to iron deficiency - from chronic blood loss from menorrhagia/ Iron sat 4; Ferritin-20; Hb 7.5 [FEB 2024]  # Poor tolerance/lack of improvement on oral iron. Discussed regarding IV iron infusion/Venofer. # Discussed the potential acute infusion reactions with IV iron; which are quite rare.  Patient understands the risk; will proceed with infusions.   # Lecucoytosis-/ neutrophilia- sec to steroids/bronchitis.   #Etiology of iron deficiency: Likely from underlying menorrhagia.  I had a long discussion with the patient regarding multiple etiologies of anemia including iron deficiency-which is  mainly caused by blood loss.  Once acute issues resolved patient will need evaluation with GI regarding screening colonoscopy.  Patient did not have any colonoscopy s yet.  # Hypokalemia: Dietary supplementation.   Thank you Ms. Florene Glen, PA for allowing me to participate in the care of your pleasant patient. Please do not hesitate to contact me with questions or concerns in the interim.  # DISPOSITION: # no labs today # venofer weekly x 3 START ASAP # follow up in 2 months MD: labs- cbc;bmp; tech review of smear; LDH; possible venofer-Dr.B  All questions were answered. The patient knows to call the clinic with any problems, questions or concerns.    Cammie Sickle, MD 02/17/2022 1:35 PM

## 2022-02-16 NOTE — Progress Notes (Unsigned)
Since ED visit and blood transfusions with IV premarin. Pt has fibroid disease. Saw a OB GYN, pt wants to pursue a hysterectomy. Since ED visit no vaginal bleeding. No pain. Pt states she is here for an iron infusion.

## 2022-02-16 NOTE — Assessment & Plan Note (Addendum)
#   Anemia-Symptomatic.  Likely due to iron deficiency - from etiology GI blood loss/menorrhagia/malabsorption. Iron sat 4; Ferritin-20; Hb 7.5 [FEB 2024]  # Poor tolerance/lack of improvement on oral iron. Discussed regarding IV iron infusion/Venofer.   # Discussed the potential acute infusion reactions with IV iron; which are quite rare.  Patient understands the risk; will proceed with infusions.   # Lecucoytosis-/ neutrophilia- sec to steroids/bronchitis.    #Etiology of iron deficiency: Unclear; I had a long discussion with the patient regarding multiple etiologies of anemia including iron deficiency-which is mainly caused by blood loss.     # Hypokalemia: Dietary supplementation.   Thank you Ms. Florene Glen, PA for allowing me to participate in the care of your pleasant patient. Please do not hesitate to contact me with questions or concerns in the interim.  # DISPOSITION: # no labs today # venofer weekly x 3 START ASAP # follow up in 2 months MD: labs- cbc;bmp; tech review of smear; LDH; possible venofer-Dr.B

## 2022-02-17 ENCOUNTER — Encounter: Payer: Self-pay | Admitting: Internal Medicine

## 2022-02-18 ENCOUNTER — Inpatient Hospital Stay: Payer: 59

## 2022-02-18 ENCOUNTER — Telehealth: Payer: 59 | Admitting: Nurse Practitioner

## 2022-02-18 VITALS — BP 157/85 | HR 80 | Temp 96.0°F | Resp 18

## 2022-02-18 DIAGNOSIS — D649 Anemia, unspecified: Secondary | ICD-10-CM

## 2022-02-18 DIAGNOSIS — J3089 Other allergic rhinitis: Secondary | ICD-10-CM | POA: Diagnosis not present

## 2022-02-18 DIAGNOSIS — D509 Iron deficiency anemia, unspecified: Secondary | ICD-10-CM | POA: Diagnosis not present

## 2022-02-18 MED ORDER — SODIUM CHLORIDE 0.9 % IV SOLN
Freq: Once | INTRAVENOUS | Status: AC
  Filled 2022-02-18: qty 250

## 2022-02-18 MED ORDER — SODIUM CHLORIDE 0.9 % IV SOLN
200.0000 mg | Freq: Once | INTRAVENOUS | Status: AC
  Administered 2022-02-18: 200 mg via INTRAVENOUS
  Filled 2022-02-18: qty 200

## 2022-02-19 MED ORDER — FLUTICASONE PROPIONATE 50 MCG/ACT NA SUSP: 2.0000 | Freq: Every day | NASAL | 6 refills | Status: DC

## 2022-02-19 NOTE — Progress Notes (Signed)
E visit for Allergic Rhinitis We are sorry that you are not feeling well.  Here is how we plan to help!  Based on what you have shared with me it looks like you have Allergic Rhinitis.  Rhinitis is when a reaction occurs that causes nasal congestion, runny nose, sneezing, and itching.  Most types of rhinitis are caused by an inflammation and are associated with symptoms in the eyes ears or throat. There are several types of rhinitis.  The most common are acute rhinitis, which is usually caused by a viral illness, allergic or seasonal rhinitis, and nonallergic or year-round rhinitis.  Nasal allergies occur certain times of the year.  Allergic rhinitis is caused when allergens in the air trigger the release of histamine in the body.  Histamine causes itching, swelling, and fluid to build up in the fragile linings of the nasal passages, sinuses and eyelids.  An itchy nose and clear discharge are common.  I recommend the following over the counter treatments: Xyzal 5 mg take 1 tablet daily  I also would recommend a nasal spray: prescription sent to pharmacy Flonase 2 sprays into each nostril once daily  You may also benefit from eye drops such as: Systane 1-2 driops each eye twice daily as needed  HOME CARE:  You can use an over-the-counter saline nasal spray as needed Avoid areas where there is heavy dust, mites, or molds Stay indoors on windy days during the pollen season Keep windows closed in home, at least in bedroom; use air conditioner. Use high-efficiency house air filter Keep windows closed in car, turn AC on re-circulate Avoid playing out with dog during pollen season  GET HELP RIGHT AWAY IF:  If your symptoms do not improve within 10 days You become short of breath You develop yellow or green discharge from your nose for over 3 days You have coughing fits  MAKE SURE YOU:  Understand these instructions Will watch your condition Will get help right away if you are not doing  well or get worse  Thank you for choosing an e-visit. Your e-visit answers were reviewed by a board certified advanced clinical practitioner to complete your personal care plan. Depending upon the condition, your plan could have included both over the counter or prescription medications. Please review your pharmacy choice. Be sure that the pharmacy you have chosen is open so that you can pick up your prescription now.  If there is a problem you may message your provider in [employer] to have the prescription routed to another pharmacy. Your safety is important to [place]. If you have drug allergies check your prescription carefully.  For the next 24 hours, you can use MyChart to ask questions about today's visit, request a non-urgent call back, or ask for a work or school excuse from your e-visit provider. You will get an email in the next two days asking about your experience. I hope that your e-visit has been valuable and will speed your recovery.   Mary-Margaret Hassell Done, FNP   5-10 minutes spent reviewing and documenting in chart.

## 2022-02-25 ENCOUNTER — Inpatient Hospital Stay: Payer: 59

## 2022-02-25 ENCOUNTER — Telehealth: Payer: 59 | Admitting: Family Medicine

## 2022-02-25 VITALS — BP 185/81 | HR 80 | Temp 98.8°F | Resp 18

## 2022-02-25 DIAGNOSIS — D649 Anemia, unspecified: Secondary | ICD-10-CM

## 2022-02-25 DIAGNOSIS — D509 Iron deficiency anemia, unspecified: Secondary | ICD-10-CM | POA: Diagnosis not present

## 2022-02-25 DIAGNOSIS — B9689 Other specified bacterial agents as the cause of diseases classified elsewhere: Secondary | ICD-10-CM

## 2022-02-25 MED ORDER — AZITHROMYCIN 250 MG PO TABS: ORAL_TABLET | ORAL | 0 refills | Status: AC

## 2022-02-25 MED ORDER — PROMETHAZINE-DM 6.25-15 MG/5ML PO SYRP: 5.0000 mL | ORAL_SOLUTION | Freq: Four times a day (QID) | ORAL | 0 refills | Status: AC | PRN

## 2022-02-25 MED ORDER — SODIUM CHLORIDE 0.9 % IV SOLN
200.0000 mg | Freq: Once | INTRAVENOUS | Status: AC
  Administered 2022-02-25: 200 mg via INTRAVENOUS
  Filled 2022-02-25: qty 200

## 2022-02-25 MED ORDER — SODIUM CHLORIDE 0.9 % IV SOLN
Freq: Once | INTRAVENOUS | Status: AC
  Filled 2022-02-25: qty 250

## 2022-02-25 NOTE — Progress Notes (Signed)
We are sorry that you are not feeling well.  Here is how we plan to help!  Based on your presentation I believe you most likely have A cough due to bacteria.  When patients have a fever and a productive cough with a change in color or increased sputum production, we are concerned about bacterial bronchitis.  If left untreated it can progress to pneumonia.  If your symptoms do not improve with your treatment plan it is important that you contact your provider.   I have prescribed Azithromyin 250 mg: two tablets now and then one tablet daily for 4 additonal days    In addition you may use promethazine dm. Hycodan is controlled and we are not able to prescribe it through telehealth.     From your responses in the eVisit questionnaire you describe inflammation in the upper respiratory tract which is causing a significant cough.  This is commonly called Bronchitis and has four common causes:   Allergies Viral Infections Acid Reflux Bacterial Infection Allergies, viruses and acid reflux are treated by controlling symptoms or eliminating the cause. An example might be a cough caused by taking certain blood pressure medications. You stop the cough by changing the medication. Another example might be a cough caused by acid reflux. Controlling the reflux helps control the cough.  USE OF BRONCHODILATOR ("RESCUE") INHALERS: There is a risk from using your bronchodilator too frequently.  The risk is that over-reliance on a medication which only relaxes the muscles surrounding the breathing tubes can reduce the effectiveness of medications prescribed to reduce swelling and congestion of the tubes themselves.  Although you feel brief relief from the bronchodilator inhaler, your asthma may actually be worsening with the tubes becoming more swollen and filled with mucus.  This can delay other crucial treatments, such as oral steroid medications. If you need to use a bronchodilator inhaler daily, several times per  day, you should discuss this with your provider.  There are probably better treatments that could be used to keep your asthma under control.     HOME CARE Only take medications as instructed by your medical team. Complete the entire course of an antibiotic. Drink plenty of fluids and get plenty of rest. Avoid close contacts especially the very young and the elderly Cover your mouth if you cough or cough into your sleeve. Always remember to wash your hands A steam or ultrasonic humidifier can help congestion.   GET HELP RIGHT AWAY IF: You develop worsening fever. You become short of breath You cough up blood. Your symptoms persist after you have completed your treatment plan MAKE SURE YOU  Understand these instructions. Will watch your condition. Will get help right away if you are not doing well or get worse.    Thank you for choosing an e-visit.  Your e-visit answers were reviewed by a board certified advanced clinical practitioner to complete your personal care plan. Depending upon the condition, your plan could have included both over the counter or prescription medications.  Please review your pharmacy choice. Make sure the pharmacy is open so you can pick up prescription now. If there is a problem, you may contact your provider through [employer] and have the prescription routed to another pharmacy.  Your safety is important to [place]. If you have drug allergies check your prescription carefully.   For the next 24 hours you can use MyChart to ask questions about today's visit, request a non-urgent call back, or ask for a work or  school excuse. You will get an email in the next two days asking about your experience. I hope that your e-visit has been valuable and will speed your recovery.    have provided 5 minutes of non face to face time during this encounter for chart review and documentation.

## 2022-03-04 ENCOUNTER — Inpatient Hospital Stay: Payer: 59

## 2022-03-04 VITALS — BP 180/86 | HR 79 | Temp 98.0°F

## 2022-03-04 DIAGNOSIS — D649 Anemia, unspecified: Secondary | ICD-10-CM

## 2022-03-04 DIAGNOSIS — D509 Iron deficiency anemia, unspecified: Secondary | ICD-10-CM | POA: Diagnosis not present

## 2022-03-04 MED ORDER — SODIUM CHLORIDE 0.9% FLUSH
10.0000 mL | Freq: Once | INTRAVENOUS | Status: AC | PRN
  Administered 2022-03-04: 10 mL
  Filled 2022-03-04: qty 10

## 2022-03-04 MED ORDER — SODIUM CHLORIDE 0.9 % IV SOLN
Freq: Once | INTRAVENOUS | Status: AC
  Filled 2022-03-04: qty 250

## 2022-03-04 MED ORDER — SODIUM CHLORIDE 0.9 % IV SOLN
200.0000 mg | Freq: Once | INTRAVENOUS | Status: AC
  Administered 2022-03-04: 200 mg via INTRAVENOUS
  Filled 2022-03-04: qty 200

## 2022-03-04 NOTE — Patient Instructions (Signed)

## 2022-03-04 NOTE — Progress Notes (Signed)
Patient tolerated Venofer infusion well, no questions/concerns voiced. Monitored 30 min post transfusion. Patient stable at discharge. VSS. AVS given.

## 2022-03-08 ENCOUNTER — Telehealth: Payer: Self-pay

## 2022-03-08 NOTE — Telephone Encounter (Signed)
Pt made aware no questions at this time.

## 2022-04-07 ENCOUNTER — Telehealth: Payer: Self-pay

## 2022-04-18 ENCOUNTER — Ambulatory Visit: Payer: 59 | Admitting: Internal Medicine

## 2022-04-18 ENCOUNTER — Ambulatory Visit: Payer: 59

## 2022-04-18 ENCOUNTER — Other Ambulatory Visit: Payer: 59

## 2022-04-19 ENCOUNTER — Inpatient Hospital Stay (HOSPITAL_BASED_OUTPATIENT_CLINIC_OR_DEPARTMENT_OTHER): Payer: 59 | Admitting: Internal Medicine

## 2022-04-19 ENCOUNTER — Ambulatory Visit: Payer: 59 | Admitting: Obstetrics and Gynecology

## 2022-04-19 ENCOUNTER — Encounter: Payer: Self-pay | Admitting: Obstetrics and Gynecology

## 2022-04-19 ENCOUNTER — Inpatient Hospital Stay: Payer: 59

## 2022-04-19 ENCOUNTER — Encounter: Payer: Self-pay | Admitting: Internal Medicine

## 2022-04-19 ENCOUNTER — Inpatient Hospital Stay: Payer: 59 | Attending: Internal Medicine

## 2022-04-19 VITALS — BP 106/100 | HR 79 | Ht 66.0 in | Wt 215.4 lb

## 2022-04-19 VITALS — BP 160/91 | HR 84 | Resp 18

## 2022-04-19 DIAGNOSIS — Z800 Family history of malignant neoplasm of digestive organs: Secondary | ICD-10-CM | POA: Diagnosis not present

## 2022-04-19 DIAGNOSIS — R5383 Other fatigue: Secondary | ICD-10-CM | POA: Insufficient documentation

## 2022-04-19 DIAGNOSIS — D649 Anemia, unspecified: Secondary | ICD-10-CM

## 2022-04-19 DIAGNOSIS — N920 Excessive and frequent menstruation with regular cycle: Secondary | ICD-10-CM | POA: Diagnosis not present

## 2022-04-19 DIAGNOSIS — Z30011 Encounter for initial prescription of contraceptive pills: Secondary | ICD-10-CM

## 2022-04-19 DIAGNOSIS — D259 Leiomyoma of uterus, unspecified: Secondary | ICD-10-CM

## 2022-04-19 DIAGNOSIS — Z801 Family history of malignant neoplasm of trachea, bronchus and lung: Secondary | ICD-10-CM | POA: Diagnosis not present

## 2022-04-19 DIAGNOSIS — Z79899 Other long term (current) drug therapy: Secondary | ICD-10-CM | POA: Diagnosis not present

## 2022-04-19 DIAGNOSIS — N938 Other specified abnormal uterine and vaginal bleeding: Secondary | ICD-10-CM | POA: Diagnosis not present

## 2022-04-19 DIAGNOSIS — Z820 Family history of epilepsy and other diseases of the nervous system: Secondary | ICD-10-CM | POA: Insufficient documentation

## 2022-04-19 DIAGNOSIS — D219 Benign neoplasm of connective and other soft tissue, unspecified: Secondary | ICD-10-CM

## 2022-04-19 DIAGNOSIS — D500 Iron deficiency anemia secondary to blood loss (chronic): Secondary | ICD-10-CM | POA: Insufficient documentation

## 2022-04-19 DIAGNOSIS — Z8249 Family history of ischemic heart disease and other diseases of the circulatory system: Secondary | ICD-10-CM | POA: Insufficient documentation

## 2022-04-19 DIAGNOSIS — E876 Hypokalemia: Secondary | ICD-10-CM | POA: Diagnosis not present

## 2022-04-19 DIAGNOSIS — R059 Cough, unspecified: Secondary | ICD-10-CM | POA: Insufficient documentation

## 2022-04-19 DIAGNOSIS — Z87891 Personal history of nicotine dependence: Secondary | ICD-10-CM | POA: Diagnosis not present

## 2022-04-19 LAB — TECHNOLOGIST SMEAR REVIEW
Plt Morphology: NORMAL
RBC MORPHOLOGY: NORMAL
WBC MORPHOLOGY: NORMAL

## 2022-04-19 LAB — BASIC METABOLIC PANEL
Anion gap: 7 (ref 5–15)
BUN: 11 mg/dL (ref 6–20)
CO2: 24 mmol/L (ref 22–32)
Calcium: 9.3 mg/dL (ref 8.9–10.3)
Chloride: 104 mmol/L (ref 98–111)
Creatinine, Ser: 0.72 mg/dL (ref 0.44–1.00)
GFR, Estimated: >60 mL/min (ref >60)
Glucose, Bld: 126 mg/dL — ABNORMAL HIGH (ref 70–99)
Potassium: 3.4 mmol/L — ABNORMAL LOW (ref 3.5–5.1)
Sodium: 135 mmol/L (ref 135–145)

## 2022-04-19 LAB — CBC WITH DIFFERENTIAL/PLATELET
Abs Immature Granulocytes: 0.02 10*3/uL (ref 0.00–0.07)
Basophils Absolute: 0 10*3/uL (ref 0.0–0.1)
Basophils Relative: 0 %
Eosinophils Absolute: 0.2 10*3/uL (ref 0.0–0.5)
Eosinophils Relative: 3 %
HCT: 38.7 % (ref 36.0–46.0)
Hemoglobin: 12.3 g/dL (ref 12.0–15.0)
Immature Granulocytes: 0 %
Lymphocytes Relative: 29 %
Lymphs Abs: 2 10*3/uL (ref 0.7–4.0)
MCH: 25 pg — ABNORMAL LOW (ref 26.0–34.0)
MCHC: 31.8 g/dL (ref 30.0–36.0)
MCV: 78.7 fL — ABNORMAL LOW (ref 80.0–100.0)
Monocytes Absolute: 0.4 10*3/uL (ref 0.1–1.0)
Monocytes Relative: 6 %
Neutro Abs: 4.2 10*3/uL (ref 1.7–7.7)
Neutrophils Relative %: 62 %
Platelets: 306 10*3/uL (ref 150–400)
RBC: 4.92 MIL/uL (ref 3.87–5.11)
RDW: 17.6 % — ABNORMAL HIGH (ref 11.5–15.5)
WBC: 6.9 10*3/uL (ref 4.0–10.5)
nRBC: 0 % (ref 0.0–0.2)

## 2022-04-19 LAB — LACTATE DEHYDROGENASE: LDH: 162 U/L (ref 98–192)

## 2022-04-19 MED ORDER — NORETHINDRONE 0.35 MG PO TABS: 1.0000 | ORAL_TABLET | Freq: Every day | ORAL | 1 refills | Status: DC

## 2022-04-19 MED ORDER — SODIUM CHLORIDE 0.9 % IV SOLN
Freq: Once | INTRAVENOUS | Status: AC
  Filled 2022-04-19: qty 250

## 2022-04-19 MED ORDER — SODIUM CHLORIDE 0.9 % IV SOLN
200.0000 mg | Freq: Once | INTRAVENOUS | Status: AC
  Administered 2022-04-19: 200 mg via INTRAVENOUS
  Filled 2022-04-19: qty 200

## 2022-04-19 NOTE — Progress Notes (Signed)
[facility]  Patient Care Team: Henreitta Leber, PA-C as PCP - General (Obstetrics and Gynecology)  CHIEF COMPLAINTS/PURPOSE OF CONSULTATION: ANEMIA  HEMATOLOGY HISTORY  # ANEMIA[Hb; MCV-platelets- WBC; Iron sat; ferritin;  GFR- CT/US- ;  EGD/colonoscopy-NONE  HISTORY OF PRESENTING ILLNESS:  Alexis Torres 52 y.o.  female pleasant patient with severe iron deficiency anemia secondary to menorrhagia is here for follow-up.  In the interim patient underwent IV iron infusion.  Infusions uneventfully.  Patient has a longstanding history of menorrhagia which is attributed to her fibroids. Saw a OB GYN, pt wants trial of OCPs prior to hysterectomy.    Review of Systems  Constitutional:  Positive for malaise/fatigue. Negative for chills, diaphoresis, fever and weight loss.  HENT:  Negative for nosebleeds and sore throat.   Eyes:  Negative for double vision.  Respiratory:  Positive for cough. Negative for hemoptysis, sputum production, shortness of breath and wheezing.   Cardiovascular:  Negative for chest pain, palpitations, orthopnea and leg swelling.  Gastrointestinal:  Negative for abdominal pain, blood in stool, constipation, diarrhea, heartburn, melena, nausea and vomiting.  Genitourinary:  Negative for dysuria, frequency and urgency.  Musculoskeletal:  Negative for back pain and joint pain.  Skin: Negative.  Negative for itching and rash.  Neurological:  Negative for dizziness, tingling, focal weakness, weakness and headaches.  Endo/Heme/Allergies:  Does not bruise/bleed easily.  Psychiatric/Behavioral:  Negative for depression. The patient is not nervous/anxious and does not have insomnia.      MEDICAL HISTORY:  Past Medical History:  Diagnosis Date   Anemia     SURGICAL HISTORY: Past Surgical History:  Procedure Laterality Date   WISDOM TOOTH EXTRACTION      SOCIAL HISTORY: Social History   Socioeconomic History   Marital status: Single     Spouse name: Not on file   Number of children: Not on file   Years of education: Not on file   Highest education level: Not on file  Occupational History   Not on file  Tobacco Use   Smoking status: Former    Types: Cigarettes   Smokeless tobacco: Never   Tobacco comments:    QUIT IN DEC.  Vaping Use   Vaping Use: Never used  Substance and Sexual Activity   Alcohol use: No   Drug use: No   Sexual activity: Not Currently  Other Topics Concern   Not on file  Social History Narrative   Not on file   Social Determinants of Health   Financial Resource Strain: Not on file  Food Insecurity: Food Insecurity Present (02/16/2022)   Hunger Vital Sign    Worried About Running Out of Food in the Last Year: Sometimes true    Ran Out of Food in the Last Year: Never true  Transportation Needs: No Transportation Needs (02/16/2022)   PRAPARE - Administrator, Civil Service (Medical): No    Lack of Transportation (Non-Medical): No  Physical Activity: Not on file  Stress: Not on file  Social Connections: Not on file  Intimate Partner Violence: Not At Risk (02/16/2022)   Humiliation, Afraid, Rape, and Kick questionnaire    Fear of Current or Ex-Partner: No    Emotionally Abused: No    Physically Abused: No    Sexually Abused: No    FAMILY HISTORY: Family History  Problem Relation Age of Onset   Cancer Mother        COLON   Stomach cancer Mother    Heart  disease Father    Lung cancer Father    Seizures Sister        EPILEPSY    ALLERGIES:  has No Known Allergies.  MEDICATIONS:  Current Outpatient Medications  Medication Sig Dispense Refill   norethindrone (MICRONOR) 0.35 MG tablet Take 1 tablet (0.35 mg total) by mouth daily. 84 tablet 1   benzonatate (TESSALON) 100 MG capsule Take 1 capsule (100 mg total) by mouth 3 (three) times daily as needed. (Patient not taking: Reported on 04/19/2022) 30 capsule 0   fluticasone (FLONASE) 50 MCG/ACT nasal spray Place 2 sprays  into both nostrils daily. (Patient not taking: Reported on 04/19/2022) 16 g 6   iron polysaccharides (NIFEREX) 150 MG capsule Take 1 capsule (150 mg total) by mouth daily. 30 capsule 0   megestrol (MEGACE) 40 MG tablet Take 1 tablet (40 mg total) by mouth 2 (two) times daily. (Patient not taking: Reported on 04/19/2022) 60 tablet 5   No current facility-administered medications for this visit.     Marland Kitchen.  PHYSICAL EXAMINATION:   Vitals:   04/19/22 1304  BP: (!) 160/89  Pulse: 84  Resp: 18  SpO2: 100%   Filed Weights   04/19/22 1259  Weight: 221 lb (100.2 kg)    Physical Exam Vitals and nursing note reviewed.  HENT:     Head: Normocephalic and atraumatic.     Mouth/Throat:     Pharynx: Oropharynx is clear.  Eyes:     Extraocular Movements: Extraocular movements intact.     Pupils: Pupils are equal, round, and reactive to light.  Cardiovascular:     Rate and Rhythm: Normal rate and regular rhythm.  Pulmonary:     Comments: Decreased breath sounds bilaterally.  Abdominal:     Palpations: Abdomen is soft.  Musculoskeletal:        General: Normal range of motion.     Cervical back: Normal range of motion.  Skin:    General: Skin is warm.  Neurological:     General: No focal deficit present.     Mental Status: She is alert and oriented to person, place, and time.  Psychiatric:        Behavior: Behavior normal.        Judgment: Judgment normal.      LABORATORY DATA:  I have reviewed the data as listed Lab Results  Component Value Date   WBC 6.9 04/19/2022   HGB 12.3 04/19/2022   HCT 38.7 04/19/2022   MCV 78.7 (L) 04/19/2022   PLT 306 04/19/2022   Recent Labs    02/09/22 1335 02/10/22 0633 04/19/22 1236  NA 136 135 135  K 2.9* 3.0* 3.4*  CL 101 103 104  CO2 26 24 24   GLUCOSE 147* 153* 126*  BUN 9 12 11   CREATININE 0.80 0.70 0.72  CALCIUM 8.5* 8.3* 9.3  GFRNONAA >60 >60 >60     No results found.  ASSESSMENT & PLAN:   Symptomatic anemia #  Anemia-Symptomatic.  Likely due to iron deficiency - from chronic blood loss from menorrhagia/ Iron sat 4; Ferritin-20; Hb 7.5 [FEB 2024] - s/p venofer.   # Hb 12.3; normal. Recommend gentle iron [iron biglycinate; 28 mg ] 1 pill a day.  This pill is unlikely to cause stomach upset or cause constipation.   #Etiology of iron deficiency: Likely from underlying menorrhagiahistory of menorrhagia which is attributed to her fibroids. Saw a OB GYN, pt wants trial of OCPs prior to hysterectomy. Recommend GI regarding screening colonoscopy.  Patient did not have any colonoscopy s yet.  # Hypokalemia: improved; continue Dietary supplementation.   Family Hx of cancer: mom- colon cancer in 54s; recommend GI evaluation.   # DISPOSITION: # referral to [facility] re: iron def anemia/screening  # venofer  # follow up in 3 months MD: labs-cbc/bmp;  possible venofer-Dr.B  All questions were answered. The patient knows to call the clinic with any problems, questions or concerns.    Earna Coder, MD 04/19/2022 1:35 PM

## 2022-04-19 NOTE — Patient Instructions (Signed)
#  Recommend gentle iron [iron biglycinate; 28 mg ] 1 pill a day.  This pill is unlikely to cause stomach upset or cause constipation.  

## 2022-04-19 NOTE — Assessment & Plan Note (Addendum)
#   Anemia-Symptomatic.  Likely due to iron deficiency - from chronic blood loss from menorrhagia/ Iron sat 4; Ferritin-20; Hb 7.5 [FEB 2024] - s/p venofer.   # Hb 12.3; normal. Recommend gentle iron [iron biglycinate; 28 mg ] 1 pill a day.  This pill is unlikely to cause stomach upset or cause constipation.   #Etiology of iron deficiency: Likely from underlying menorrhagiahistory of menorrhagia which is attributed to her fibroids. Saw a OB GYN, pt wants trial of OCPs prior to hysterectomy. Recommend GI regarding screening colonoscopy.  Patient did not have any colonoscopy s yet.  # Hypokalemia: improved; continue Dietary supplementation.   Family Hx of cancer: mom- colon cancer in 41s; recommend GI evaluation.   # DISPOSITION: # referral to [facility] re: iron def anemia/screening  # venofer  # follow up in 3 months MD: labs-cbc/bmp;  possible venofer-Dr.B

## 2022-04-19 NOTE — Patient Instructions (Signed)

## 2022-04-19 NOTE — Progress Notes (Signed)
Patient presents today to discuss possible surgical options. She states experiencing irregular bleeding that began in late January which resulted in her going to the ED. Since then she has taken and stopped taking Megace along with having an ultrasound showing multiple fibroids. Patient states she is interested in a hysterectomy at this time.

## 2022-04-19 NOTE — Progress Notes (Signed)
Patient has no questions today

## 2022-04-19 NOTE — Progress Notes (Signed)
HPI:      [name] is a 52 y.o. G2P0011 who LMP was Patient's last menstrual period was 03/04/2022 (approximate).  Subjective:   She presents today for follow-up of uterine fibroids and anemia caused by heavy menstrual bleeding.  She has had an iron infusion and was placed on Megace.  She became amenorrheic on Megace.  She has a follow-up appointment today for a blood count to see if she needs further iron infusions.  She stopped the Megace 3 weeks ago and has not had any bleeding since that time.  She was considering hysterectomy for uterine fibroids but would like to consider other options today.    Hx: The following portions of the patient's history were reviewed and updated as appropriate:             She  has a past medical history of Anemia. She does not have any pertinent problems on file. She  has a past surgical history that includes Wisdom tooth extraction. Her family history includes Cancer in her mother; Heart disease in her father; Lung cancer in her father; Seizures in her sister; Stomach cancer in her mother. She  reports that she has quit smoking. Her smoking use included cigarettes. She has never used smokeless tobacco. She reports that she does not drink alcohol and does not use drugs. She has a current medication list which includes the following prescription(s): benzonatate, fluticasone, norethindrone, iron polysaccharides, and megestrol. She has No Known Allergies.       Review of Systems:  Review of Systems  Constitutional: Denied constitutional symptoms, night sweats, recent illness, fatigue, fever, insomnia and weight loss.  Eyes: Denied eye symptoms, eye pain, photophobia, vision change and visual disturbance.  Ears/Nose/Throat/Neck: Denied ear, nose, throat or neck symptoms, hearing loss, nasal discharge, sinus congestion and sore throat.  Cardiovascular: Denied cardiovascular symptoms, arrhythmia, chest pain/pressure, edema, exercise intolerance,  orthopnea and palpitations.  Respiratory: Denied pulmonary symptoms, asthma, pleuritic pain, productive sputum, cough, dyspnea and wheezing.  Gastrointestinal: Denied, gastro-esophageal reflux, melena, nausea and vomiting.  Genitourinary: See HPI for additional information.  Musculoskeletal: Denied musculoskeletal symptoms, stiffness, swelling, muscle weakness and myalgia.  Dermatologic: Denied dermatology symptoms, rash and scar.  Neurologic: Denied neurology symptoms, dizziness, headache, neck pain and syncope.  Psychiatric: Denied psychiatric symptoms, anxiety and depression.  Endocrine: Denied endocrine symptoms including hot flashes and night sweats.   Meds:   Current Outpatient Medications on File Prior to Visit  Medication Sig Dispense Refill   benzonatate (TESSALON) 100 MG capsule Take 1 capsule (100 mg total) by mouth 3 (three) times daily as needed. 30 capsule 0   fluticasone (FLONASE) 50 MCG/ACT nasal spray Place 2 sprays into both nostrils daily. 16 g 6   iron polysaccharides (NIFEREX) 150 MG capsule Take 1 capsule (150 mg total) by mouth daily. 30 capsule 0   megestrol (MEGACE) 40 MG tablet Take 1 tablet (40 mg total) by mouth 2 (two) times daily. (Patient not taking: Reported on 04/19/2022) 60 tablet 5   No current facility-administered medications on file prior to visit.      Objective:     Vitals:   04/19/22 0905 04/19/22 0910  BP: (!) 177/107 (!) 106/100  Pulse: 79    Filed Weights   04/19/22 0905  Weight: 215 lb 6.4 oz (97.7 kg)              Ultrasound results reviewed          Assessment:    [id number]  Patient Active Problem List   Diagnosis Date Noted   Symptomatic anemia 02/09/2022   Hypokalemia 02/09/2022   Hyperglycemia 02/09/2022   Leukocytosis 02/09/2022     1. Fibroids   2. DUB (dysfunctional uterine bleeding)   3. Anemia, unspecified type   4. Initiation of OCP (BCP)     Patient took Megace and became amenorrheic.  She has not had a  menstrual period in the last 3 weeks despite not taking any further Megace.  My assumption is that her bleeding will return and again be heavy because of the uterine fibroids.  She does not report any hot flashes so may or may not be close to menopause (age)   Plan:            1.  We discussed multiple options for cycle control as well as management with UFE and hysterectomy.  Natural course and history of uterine fibroids was discussed in detail.  Menopause and its effect on fibroids was discussed.  Patient has chosen to try cycle control until she reaches menopause.  We will start with a lower dose of progesterone than Megace (Micronor) and see if this gives her enough cycle control.  If not we can increase to a more potent progesterone if necessary.  Orders No orders of the defined types were placed in this encounter.    Meds ordered this encounter  Medications   norethindrone (MICRONOR) 0.35 MG tablet    Sig: Take 1 tablet (0.35 mg total) by mouth daily.    Dispense:  84 tablet    Refill:  1      F/U  Return in about 3 months (around 07/19/2022) for Annual Physical. I spent 23 minutes involved in the care of this patient preparing to see the patient by obtaining and reviewing her medical history (including labs, imaging tests and prior procedures), documenting clinical information in the electronic health record (EHR), counseling and coordinating care plans, writing and sending prescriptions, ordering tests or procedures and in direct communicating with the patient and medical staff discussing pertinent items from her history and physical exam.  Elonda Husky, M.D. 04/19/2022 9:40 AM

## 2022-06-13 ENCOUNTER — Ambulatory Visit (INDEPENDENT_AMBULATORY_CARE_PROVIDER_SITE_OTHER): Payer: 59 | Admitting: Nurse Practitioner

## 2022-06-13 ENCOUNTER — Encounter: Payer: Self-pay | Admitting: Nurse Practitioner

## 2022-06-13 VITALS — BP 174/80 | HR 85 | Temp 98.3°F | Resp 16 | Ht 66.0 in | Wt 232.5 lb

## 2022-06-13 DIAGNOSIS — R202 Paresthesia of skin: Secondary | ICD-10-CM | POA: Insufficient documentation

## 2022-06-13 DIAGNOSIS — Z6837 Body mass index (BMI) 37.0-37.9, adult: Secondary | ICD-10-CM | POA: Diagnosis not present

## 2022-06-13 DIAGNOSIS — Z23 Encounter for immunization: Secondary | ICD-10-CM | POA: Diagnosis not present

## 2022-06-13 DIAGNOSIS — Z1231 Encounter for screening mammogram for malignant neoplasm of breast: Secondary | ICD-10-CM

## 2022-06-13 DIAGNOSIS — I10 Essential (primary) hypertension: Secondary | ICD-10-CM

## 2022-06-13 DIAGNOSIS — E669 Obesity, unspecified: Secondary | ICD-10-CM | POA: Diagnosis not present

## 2022-06-13 LAB — POCT GLYCOSYLATED HEMOGLOBIN (HGB A1C): Hemoglobin A1C: 5.3 % (ref 4.0–5.6)

## 2022-06-13 MED ORDER — OLMESARTAN MEDOXOMIL 20 MG PO TABS
20.0000 mg | ORAL_TABLET | Freq: Every day | ORAL | 0 refills | Status: DC
Start: 2022-06-13 — End: 2022-06-27

## 2022-06-13 NOTE — Progress Notes (Unsigned)
New Patient Office Visit  Subjective    Patient ID: Alexis Torres, female    DOB: 08/19/1970  Age: 52 y.o. MRN: [id number]  CC:  Chief Complaint  Patient presents with   Establish Care   Weight Gain    Thinks its from the medication she is on.   Recurrent Skin Infections    Tiny but under both arm   Numbness    On finger on both hands. Not all finger just a few    HPI Alexis Torres presents to establish care  HTN: just since hospital. Has not been on medications in the past. States that she was referred to primary care. States that she is having numbness in bilateral finger tips.   Weight gain: states that she is still gaining weight. She has been off the megace for 2 months. Walking 2 times a week at 20 ins at a time.  States that she will skip breakfast most days. Will do a small lunch like salad and fruit. States that she will have dinner that is home cooked. States that she drinks all day.  2 cans a day   Numbness: states that it has been since she started megace. States that he mentioned it to GYN and they states it was carpal tunnel and did the braces and has been present since 4 months. States that it has not improved with the bracing. Feels like it may be improving some    Colonoscopy: with Dr Allegra Lai in August   Mammogram: needs updating   Pap Smear: needs updating   Tdap: needs updating. Update today   Flu: out of season Covid: pfizer Shingles: first vaccine today       Outpatient Encounter Medications as of 06/13/2022  Medication Sig   norethindrone (MICRONOR) 0.35 MG tablet Take 1 tablet (0.35 mg total) by mouth daily.   olmesartan (BENICAR) 20 MG tablet Take 1 tablet (20 mg total) by mouth daily.   [DISCONTINUED] benzonatate (TESSALON) 100 MG capsule Take 1 capsule (100 mg total) by mouth 3 (three) times daily as needed.   [DISCONTINUED] fluticasone (FLONASE) 50 MCG/ACT nasal spray Place 2 sprays into both nostrils daily.   [DISCONTINUED] iron  polysaccharides (NIFEREX) 150 MG capsule Take 1 capsule (150 mg total) by mouth daily.   [DISCONTINUED] megestrol (MEGACE) 40 MG tablet Take 1 tablet (40 mg total) by mouth 2 (two) times daily.   No facility-administered encounter medications on file as of 06/13/2022.    Past Medical History:  Diagnosis Date   Anemia    Blood transfusion without reported diagnosis February   Hypertension February    Past Surgical History:  Procedure Laterality Date   WISDOM TOOTH EXTRACTION      Family History  Problem Relation Age of Onset   Cancer Mother        COLON   Stomach cancer Mother    Heart disease Father    Lung cancer Father    Cancer Father    Seizures Sister        EPILEPSY    Social History   Socioeconomic History   Marital status: Single    Spouse name: Not on file   Number of children: 1   Years of education: Not on file   Highest education level: Not on file  Occupational History   Not on file  Tobacco Use   Smoking status: Former    Packs/day: 0.00    Years: 2.00    Additional pack years:  0.00    Total pack years: 0.00    Types: Cigarettes, Cigars    Quit date: 04/24/2021    Years since quitting: 1.1   Smokeless tobacco: Never   Tobacco comments:    QUIT IN DEC.  Vaping Use   Vaping Use: Never used  Substance and Sexual Activity   Alcohol use: No   Drug use: No   Sexual activity: Not Currently    Birth control/protection: Abstinence, Pill  Other Topics Concern   Not on file  Social History Narrative   Fulltime: customer service rep      [employer] (29)      Hobbies: reading, Herb planting,    Social Determinants of Health   Financial Resource Strain: Not on file  Food Insecurity: Food Insecurity Present (02/16/2022)   Hunger Vital Sign    Worried About Running Out of Food in the Last Year: Sometimes true    Ran Out of Food in the Last Year: Never true  Transportation Needs: No Transportation Needs (02/16/2022)   PRAPARE - Scientist, research (physical sciences) (Medical): No    Lack of Transportation (Non-Medical): No  Physical Activity: Not on file  Stress: Not on file  Social Connections: Not on file  Intimate Partner Violence: Not At Risk (02/16/2022)   Humiliation, Afraid, Rape, and Kick questionnaire    Fear of Current or Ex-Partner: No    Emotionally Abused: No    Physically Abused: No    Sexually Abused: No    Review of Systems  Constitutional:  Negative for chills and fever.  Respiratory:  Negative for shortness of breath.   Cardiovascular:  Positive for leg swelling. Negative for chest pain.  Gastrointestinal:        Bm daily   Genitourinary:  Negative for dysuria and hematuria.  Neurological:  Negative for dizziness and headaches.  Psychiatric/Behavioral:  Negative for hallucinations and suicidal ideas.         Objective    BP (!) 174/80   Pulse 85   Temp 98.3 F (36.8 C)   Resp 16   Ht 5\' 6"  (1.676 m)   Wt 232 lb 8 oz (105.5 kg)   SpO2 98%   BMI 37.53 kg/m   Physical Exam Vitals and nursing note reviewed.  Constitutional:      Appearance: Normal appearance.  HENT:     Right Ear: Tympanic membrane, ear canal and external ear normal.     Left Ear: Tympanic membrane, ear canal and external ear normal.     Mouth/Throat:     Mouth: Mucous membranes are moist.     Pharynx: Oropharynx is clear.  Eyes:     Extraocular Movements: Extraocular movements intact.     Pupils: Pupils are equal, round, and reactive to light.     Comments: Wears glasses  Cardiovascular:     Rate and Rhythm: Normal rate and regular rhythm.     Pulses: Normal pulses.     Heart sounds: Normal heart sounds.  Pulmonary:     Effort: Pulmonary effort is normal.     Breath sounds: Normal breath sounds.  Musculoskeletal:     Right lower leg: 1+ Pitting Edema present.     Left lower leg: 1+ Pitting Edema present.  Lymphadenopathy:     Cervical: No cervical adenopathy.  Neurological:     General: No focal deficit present.      Mental Status: She is alert.     Deep Tendon Reflexes:  Reflex Scores:      Bicep reflexes are 2+ on the right side and 2+ on the left side.      Patellar reflexes are 2+ on the right side and 2+ on the left side.    Comments: Bilateral upper and lower extremity strength 5/5     {Labs (Optional):23779}    Assessment & Plan:   Problem List Items Addressed This Visit       Cardiovascular and Mediastinum   Primary hypertension    Patient's blood pressure extremely in office.  Somewhat better on recheck but still elevated will start patient on olmesartan 20 mg daily.  Follow-up 2 weeks for BMP recheck along with blood pressure recheck.  Will see if the numbness and tingling in the bilateral first and second finger improved with blood pressure control.      Relevant Medications   olmesartan (BENICAR) 20 MG tablet   Other Relevant Orders   CBC   Comprehensive metabolic panel   TSH     Other   Obesity (BMI 30-39.9)    Patient was concern for weight gain she was on Megace but has been off the medication for couple months but still gaining weight.  Continue working on lifestyle modifications to get blood pressure under control will check labs inclusive of lipid, TSH, A1c.      Relevant Orders   Hemoglobin A1c   Lipid panel   Paresthesias    Thought it was carpal tunnel has been doing the appropriate treatment without improvement or resolution.  Check TSH and B12 level.  Curious if this will improve once blood pressure is in acceptable range      Relevant Orders   CBC   Comprehensive metabolic panel   TSH   Vitamin B12   Other Visit Diagnoses     Need for Tdap vaccination    -  Primary   Relevant Orders   Tdap vaccine greater than or equal to 7yo IM   Need for shingles vaccine       Relevant Orders   Varicella-zoster vaccine IM   Screening mammogram for breast cancer       Relevant Orders   MM 3D SCREENING MAMMOGRAM BILATERAL BREAST       Return in about 2 weeks  (around 06/27/2022) for BP recheck, BMP.   Audria Nine, NP

## 2022-06-13 NOTE — Assessment & Plan Note (Signed)
Patient was concern for weight gain she was on Megace but has been off the medication for couple months but still gaining weight.  Continue working on lifestyle modifications to get blood pressure under control will check labs inclusive of lipid, TSH, A1c.

## 2022-06-13 NOTE — Patient Instructions (Signed)
Nice to see you today I will be in touch with the labs once I have reviewed them I have sent in medication to the pharmacy to start taking Follow up with me in 2 weeks, sooner if you need me

## 2022-06-13 NOTE — Assessment & Plan Note (Signed)
Thought it was carpal tunnel has been doing the appropriate treatment without improvement or resolution.  Check TSH and B12 level.  Curious if this will improve once blood pressure is in acceptable range

## 2022-06-13 NOTE — Assessment & Plan Note (Signed)
Patient's blood pressure extremely in office.  Somewhat better on recheck but still elevated will start patient on olmesartan 20 mg daily.  Follow-up 2 weeks for BMP recheck along with blood pressure recheck.  Will see if the numbness and tingling in the bilateral first and second finger improved with blood pressure control.

## 2022-06-14 LAB — VITAMIN B12: Vitamin B-12: 322 pg/mL (ref 211–911)

## 2022-06-14 LAB — LIPID PANEL
Cholesterol: 148 mg/dL (ref 0–200)
HDL: 43.1 mg/dL (ref >39.00)
LDL Cholesterol: 75 mg/dL (ref 0–99)
NonHDL: 104.54
Total CHOL/HDL Ratio: 3
Triglycerides: 147 mg/dL (ref 0.0–149.0)
VLDL: 29.4 mg/dL (ref 0.0–40.0)

## 2022-06-14 LAB — COMPREHENSIVE METABOLIC PANEL
ALT: 16 U/L (ref 0–35)
AST: 25 U/L (ref 0–37)
Albumin: 3.9 g/dL (ref 3.5–5.2)
Alkaline Phosphatase: 46 U/L (ref 39–117)
BUN: 9 mg/dL (ref 6–23)
CO2: 25 mEq/L (ref 19–32)
Calcium: 9.5 mg/dL (ref 8.4–10.5)
Chloride: 103 mEq/L (ref 96–112)
Creatinine, Ser: 0.54 mg/dL (ref 0.40–1.20)
GFR: 106.35 mL/min (ref >60.00)
Glucose, Bld: 105 mg/dL — ABNORMAL HIGH (ref 70–99)
Potassium: 3.9 mEq/L (ref 3.5–5.1)
Sodium: 137 mEq/L (ref 135–145)
Total Bilirubin: 0.5 mg/dL (ref 0.2–1.2)
Total Protein: 7.9 g/dL (ref 6.0–8.3)

## 2022-06-14 LAB — CBC
HCT: 37.4 % (ref 36.0–46.0)
Hemoglobin: 12 g/dL (ref 12.0–15.0)
MCHC: 32 g/dL (ref 30.0–36.0)
MCV: 82.1 fl (ref 78.0–100.0)
Platelets: 67 10*3/uL — ABNORMAL LOW (ref 150.0–400.0)
RBC: 4.56 Mil/uL (ref 3.87–5.11)
RDW: 17 % — ABNORMAL HIGH (ref 11.5–15.5)
WBC: 5.7 10*3/uL (ref 4.0–10.5)

## 2022-06-14 LAB — TSH: TSH: 1.24 u[IU]/mL (ref 0.35–5.50)

## 2022-06-15 ENCOUNTER — Encounter: Payer: Self-pay | Admitting: Nurse Practitioner

## 2022-06-15 DIAGNOSIS — R202 Paresthesia of skin: Secondary | ICD-10-CM

## 2022-06-15 MED ORDER — PREDNISONE 20 MG PO TABS
ORAL_TABLET | ORAL | 0 refills | Status: AC
Start: 2022-06-15 — End: 2022-06-21

## 2022-06-27 ENCOUNTER — Encounter: Payer: Self-pay | Admitting: Nurse Practitioner

## 2022-06-27 ENCOUNTER — Ambulatory Visit (INDEPENDENT_AMBULATORY_CARE_PROVIDER_SITE_OTHER): Payer: 59 | Admitting: Nurse Practitioner

## 2022-06-27 VITALS — BP 162/82 | HR 80 | Temp 97.9°F | Resp 16 | Ht 66.0 in | Wt 234.2 lb

## 2022-06-27 DIAGNOSIS — R202 Paresthesia of skin: Secondary | ICD-10-CM | POA: Diagnosis not present

## 2022-06-27 DIAGNOSIS — Z6830 Body mass index (BMI) 30.0-30.9, adult: Secondary | ICD-10-CM

## 2022-06-27 DIAGNOSIS — E669 Obesity, unspecified: Secondary | ICD-10-CM

## 2022-06-27 DIAGNOSIS — I10 Essential (primary) hypertension: Secondary | ICD-10-CM

## 2022-06-27 DIAGNOSIS — D696 Thrombocytopenia, unspecified: Secondary | ICD-10-CM | POA: Insufficient documentation

## 2022-06-27 MED ORDER — OLMESARTAN MEDOXOMIL 40 MG PO TABS: 40.0000 mg | ORAL_TABLET | Freq: Every day | ORAL | 0 refills | Status: DC

## 2022-06-27 NOTE — Assessment & Plan Note (Signed)
Has resolved currently with blood pressure trending down. Stable at this juncture

## 2022-06-27 NOTE — Assessment & Plan Note (Signed)
Patient has implemented lifestyle changes inclusive of diet and exercise. Keep up the good work

## 2022-06-27 NOTE — Assessment & Plan Note (Signed)
Incidental finding on labs. Repeat CBC today

## 2022-06-27 NOTE — Progress Notes (Signed)
Established Patient Office Visit  Subjective   Patient ID: Alexis Torres, female    DOB: 06/09/1970  Age: 52 y.o. MRN: [id number]  Chief Complaint  Patient presents with   Hypertension      HTN: patient was seen on 06/13/2022.  Patient was noticed to have high blood pressure in the hospital no history of the same in the past and patient has not been medicated in the past.  Patient was placed on olmesartan 20 mg daily  States that she has been workin go ndiet and exercise . States that she will walk 30 mins at at time for 7 days a week. States that she is not over doing it.   States that she is doing on e soda a day. State that she did try the diet soda but has not found one that she has liked. States that she is including water.   States that she is able to check her blood pressure at ome states 120/80 all the way up to 165/80 at home.   Numbness: states that it has resolved Edema: states that over the past few days it has resolved and her feet feel normal     Review of Systems  Constitutional:  Negative for chills and fever.  Respiratory:  Negative for shortness of breath.   Cardiovascular:  Negative for chest pain and leg swelling.  Neurological:  Negative for dizziness, tingling and headaches.  Psychiatric/Behavioral:  Negative for hallucinations and suicidal ideas.       Objective:     BP (!) 162/82   Pulse 80   Temp 97.9 F (36.6 C)   Resp 16   Ht 5\' 6"  (1.676 m)   Wt 234 lb 4 oz (106.3 kg)   SpO2 98%   BMI 37.81 kg/m  BP Readings from Last 3 Encounters:  06/27/22 (!) 162/82  06/13/22 (!) 174/80  04/19/22 (!) 160/91   Wt Readings from Last 3 Encounters:  06/27/22 234 lb 4 oz (106.3 kg)  06/13/22 232 lb 8 oz (105.5 kg)  04/19/22 221 lb (100.2 kg)      Physical Exam Vitals and nursing note reviewed.  Constitutional:      Appearance: Normal appearance.  Cardiovascular:     Rate and Rhythm: Normal rate and regular rhythm.     Heart sounds:  Normal heart sounds.  Pulmonary:     Effort: Pulmonary effort is normal.     Breath sounds: Normal breath sounds.  Musculoskeletal:     Right lower leg: No edema.     Left lower leg: No edema.  Neurological:     Mental Status: She is alert.      No results found for any visits on 06/27/22.    The 10-year ASCVD risk score (Arnett DK, et al., 2019) is: 9.6%    Assessment & Plan:   Problem List Items Addressed This Visit       Cardiovascular and Mediastinum   Primary hypertension - Primary    Patient was maintained on olmesartan 20mg . Her blood pressure is still above goal. Will increase it to 40mg  daily. She is no longer having edema       Relevant Medications   olmesartan (BENICAR) 40 MG tablet   Other Relevant Orders   CBC   Basic metabolic panel     Hematopoietic and Hemostatic   Thrombocytopenia (HCC)    Incidental finding on labs. Repeat CBC today       Relevant Orders   CBC  Other   Obesity (BMI 30-39.9)    Patient has implemented lifestyle changes inclusive of diet and exercise. Keep up the good work       Paresthesias    Has resolved currently with blood pressure trending down. Stable at this juncture       Return in about 6 weeks (around 08/08/2022) for BP recheck.    Audria Nine, NP

## 2022-06-27 NOTE — Patient Instructions (Addendum)
Nice to see you today You can take 2 tablets of the olmesartan 20mg  to equal a total of 40mg  a day.  I have sent in a new prescription in for 40mg  tablets  Follow up with me in 6 weeks, sooner if you need me

## 2022-06-27 NOTE — Assessment & Plan Note (Signed)
Patient was maintained on olmesartan 20mg . Her blood pressure is still above goal. Will increase it to 40mg  daily. She is no longer having edema

## 2022-06-28 ENCOUNTER — Ambulatory Visit: Payer: 59

## 2022-06-28 ENCOUNTER — Other Ambulatory Visit: Payer: 59

## 2022-06-28 ENCOUNTER — Ambulatory Visit: Payer: 59 | Admitting: Internal Medicine

## 2022-06-28 LAB — BASIC METABOLIC PANEL
BUN: 11 mg/dL (ref 6–23)
CO2: 27 mEq/L (ref 19–32)
Calcium: 9.3 mg/dL (ref 8.4–10.5)
Chloride: 103 mEq/L (ref 96–112)
Creatinine, Ser: 0.68 mg/dL (ref 0.40–1.20)
GFR: 100.58 mL/min (ref >60.00)
Glucose, Bld: 94 mg/dL (ref 70–99)
Potassium: 3.7 mEq/L (ref 3.5–5.1)
Sodium: 137 mEq/L (ref 135–145)

## 2022-06-28 LAB — CBC
HCT: 38.4 % (ref 36.0–46.0)
Hemoglobin: 12.3 g/dL (ref 12.0–15.0)
MCHC: 32 g/dL (ref 30.0–36.0)
MCV: 82.3 fl (ref 78.0–100.0)
Platelets: 338 10*3/uL (ref 150.0–400.0)
RBC: 4.67 Mil/uL (ref 3.87–5.11)
RDW: 15.9 % — ABNORMAL HIGH (ref 11.5–15.5)
WBC: 8.9 10*3/uL (ref 4.0–10.5)

## 2022-07-19 ENCOUNTER — Inpatient Hospital Stay (HOSPITAL_BASED_OUTPATIENT_CLINIC_OR_DEPARTMENT_OTHER): Payer: 59 | Admitting: Nurse Practitioner

## 2022-07-19 ENCOUNTER — Inpatient Hospital Stay: Payer: 59 | Attending: Internal Medicine

## 2022-07-19 ENCOUNTER — Encounter: Payer: Self-pay | Admitting: Nurse Practitioner

## 2022-07-19 ENCOUNTER — Inpatient Hospital Stay: Payer: 59

## 2022-07-19 VITALS — BP 171/77 | HR 79 | Temp 97.0°F | Wt 236.0 lb

## 2022-07-19 VITALS — BP 161/85 | HR 70

## 2022-07-19 DIAGNOSIS — D649 Anemia, unspecified: Secondary | ICD-10-CM

## 2022-07-19 DIAGNOSIS — N920 Excessive and frequent menstruation with regular cycle: Secondary | ICD-10-CM | POA: Insufficient documentation

## 2022-07-19 DIAGNOSIS — Z79899 Other long term (current) drug therapy: Secondary | ICD-10-CM | POA: Diagnosis not present

## 2022-07-19 DIAGNOSIS — D500 Iron deficiency anemia secondary to blood loss (chronic): Secondary | ICD-10-CM | POA: Insufficient documentation

## 2022-07-19 LAB — CBC WITH DIFFERENTIAL (CANCER CENTER ONLY)
Abs Immature Granulocytes: 0.04 10*3/uL (ref 0.00–0.07)
Basophils Absolute: 0 10*3/uL (ref 0.0–0.1)
Basophils Relative: 1 %
Eosinophils Absolute: 0.3 10*3/uL (ref 0.0–0.5)
Eosinophils Relative: 3 %
HCT: 36.6 % (ref 36.0–46.0)
Hemoglobin: 11.7 g/dL — ABNORMAL LOW (ref 12.0–15.0)
Immature Granulocytes: 1 %
Lymphocytes Relative: 22 %
Lymphs Abs: 1.7 10*3/uL (ref 0.7–4.0)
MCH: 26.3 pg (ref 26.0–34.0)
MCHC: 32 g/dL (ref 30.0–36.0)
MCV: 82.2 fL (ref 80.0–100.0)
Monocytes Absolute: 0.6 10*3/uL (ref 0.1–1.0)
Monocytes Relative: 7 %
Neutro Abs: 5.5 10*3/uL (ref 1.7–7.7)
Neutrophils Relative %: 66 %
Platelet Count: 353 10*3/uL (ref 150–400)
RBC: 4.45 MIL/uL (ref 3.87–5.11)
RDW: 14.2 % (ref 11.5–15.5)
WBC Count: 8.1 10*3/uL (ref 4.0–10.5)
nRBC: 0 % (ref 0.0–0.2)

## 2022-07-19 LAB — BASIC METABOLIC PANEL - CANCER CENTER ONLY
Anion gap: 7 (ref 5–15)
BUN: 9 mg/dL (ref 6–20)
CO2: 24 mmol/L (ref 22–32)
Calcium: 9 mg/dL (ref 8.9–10.3)
Chloride: 104 mmol/L (ref 98–111)
Creatinine: 0.54 mg/dL (ref 0.44–1.00)
GFR, Estimated: >60 mL/min (ref >60)
Glucose, Bld: 104 mg/dL — ABNORMAL HIGH (ref 70–99)
Potassium: 3.6 mmol/L (ref 3.5–5.1)
Sodium: 135 mmol/L (ref 135–145)

## 2022-07-19 MED ORDER — SODIUM CHLORIDE 0.9 % IV SOLN
200.0000 mg | Freq: Once | INTRAVENOUS | Status: AC
  Administered 2022-07-19: 200 mg via INTRAVENOUS
  Filled 2022-07-19: qty 200

## 2022-07-19 MED ORDER — SODIUM CHLORIDE 0.9 % IV SOLN
Freq: Once | INTRAVENOUS | Status: AC
  Filled 2022-07-19: qty 250

## 2022-07-19 NOTE — Patient Instructions (Signed)

## 2022-07-19 NOTE — Progress Notes (Signed)
Declined 30 minute post observation. Aware of risks. Vitals stable at discharge.  

## 2022-07-19 NOTE — Progress Notes (Signed)
[facility] CONSULT NOTE  Patient Care Team: Eden Emms, NP as PCP - General (Nurse Practitioner)  CHIEF COMPLAINTS/PURPOSE OF CONSULTATION: ANEMIA  HEMATOLOGY HISTORY  # ANEMIA [Hb; MCV-platelets- WBC; Iron sat; ferritin;  GFR- CT/US- ;  EGD/colonoscopy-NONE  HISTORY OF PRESENTING ILLNESS:  Alexis Torres 52 y.o. female pleasant patient with severe iron deficiency anemia secondary to menorrhagia is here for follow-up. Last received IV iron in April. Tolerated well. Has some fatigue and general malaise. She is on norethindrone for bleeding d/t fibroids with improvement in her symptoms.     Review of Systems  Constitutional:  Positive for malaise/fatigue. Negative for chills, diaphoresis, fever and weight loss.  HENT:  Negative for nosebleeds and sore throat.   Eyes:  Negative for double vision.  Respiratory:  Negative for cough, hemoptysis, sputum production, shortness of breath and wheezing.   Cardiovascular:  Negative for chest pain, palpitations, orthopnea and leg swelling.  Gastrointestinal:  Negative for abdominal pain, blood in stool, constipation, diarrhea, heartburn, melena, nausea and vomiting.  Genitourinary:  Negative for dysuria, frequency and urgency.  Musculoskeletal:  Negative for back pain and joint pain.  Skin: Negative.  Negative for itching and rash.  Neurological:  Negative for dizziness, tingling, focal weakness, weakness and headaches.  Endo/Heme/Allergies:  Does not bruise/bleed easily.  Psychiatric/Behavioral:  Negative for depression. The patient is not nervous/anxious and does not have insomnia.    MEDICAL HISTORY:  Past Medical History:  Diagnosis Date   Anemia    Blood transfusion without reported diagnosis February   Hypertension February   SURGICAL HISTORY: Past Surgical History:  Procedure Laterality Date   WISDOM TOOTH EXTRACTION     SOCIAL HISTORY: Social History   Socioeconomic History   Marital status: Single     Spouse name: Not on file   Number of children: 1   Years of education: Not on file   Highest education level: Associate degree: occupational, Scientist, product/process development, or vocational program  Occupational History   Not on file  Tobacco Use   Smoking status: Former    Packs/day: 0.00    Years: 2.00    Additional pack years: 0.00    Total pack years: 0.00    Types: Cigarettes, Cigars    Quit date: 04/24/2021    Years since quitting: 1.2   Smokeless tobacco: Never   Tobacco comments:    QUIT IN DEC.  Vaping Use   Vaping Use: Never used  Substance and Sexual Activity   Alcohol use: No   Drug use: No   Sexual activity: Not Currently    Birth control/protection: Abstinence, Pill  Other Topics Concern   Not on file  Social History Narrative   Fulltime: customer service rep      [employer] (29)      Hobbies: reading, Herb planting,    Social Determinants of Health   Financial Resource Strain: Low Risk  (06/24/2022)   Overall Financial Resource Strain (CARDIA)    Difficulty of Paying Living Expenses: Not very hard  Food Insecurity: No Food Insecurity (06/24/2022)   Hunger Vital Sign    Worried About Running Out of Food in the Last Year: Never true    Ran Out of Food in the Last Year: Never true  Transportation Needs: No Transportation Needs (06/24/2022)   PRAPARE - Administrator, Civil Service (Medical): No    Lack of Transportation (Non-Medical): No  Physical Activity: Insufficiently Active (06/24/2022)   Exercise Vital Sign  Days of Exercise per Week: 3 days    Minutes of Exercise per Session: 30 min  Stress: No Stress Concern Present (06/24/2022)   [employer] of Occupational Health - Occupational Stress Questionnaire    Feeling of Stress : Not at all  Social Connections: Moderately Isolated (06/24/2022)   Social Connection and Isolation Panel [NHANES]    Frequency of Communication with Friends and Family: Three times a week    Frequency of Social Gatherings with Friends  and Family: Once a week    Attends Religious Services: 1 to 4 times per year    Active Member of [employer] or Organizations: No    Attends Engineer, structural: Not on file    Marital Status: Never married  Intimate Partner Violence: Not At Risk (02/16/2022)   Humiliation, Afraid, Rape, and Kick questionnaire    Fear of Current or Ex-Partner: No    Emotionally Abused: No    Physically Abused: No    Sexually Abused: No   FAMILY HISTORY: Family History  Problem Relation Age of Onset   Cancer Mother        COLON   Stomach cancer Mother    Heart disease Father    Lung cancer Father    Cancer Father    Seizures Sister        EPILEPSY   ALLERGIES:  has No Known Allergies.  MEDICATIONS:  Current Outpatient Medications  Medication Sig Dispense Refill   norethindrone (MICRONOR) 0.35 MG tablet Take 1 tablet (0.35 mg total) by mouth daily. 84 tablet 1   olmesartan (BENICAR) 40 MG tablet Take 1 tablet (40 mg total) by mouth daily. 90 tablet 0   No current facility-administered medications for this visit.   PHYSICAL EXAMINATION: Vitals:   07/19/22 1346  BP: (!) 171/77  Pulse: 79  Temp: (!) 97 F (36.1 C)  SpO2: 100%   Filed Weights   07/19/22 1346  Weight: 236 lb (107 kg)   Physical Exam Constitutional:      Appearance: She is not ill-appearing.  Pulmonary:     Effort: No respiratory distress.  Abdominal:     General: There is no distension.  Skin:    General: Skin is warm.     Coloration: Skin is not pale.  Neurological:     Mental Status: She is alert and oriented to person, place, and time.  Psychiatric:        Mood and Affect: Mood normal.        Behavior: Behavior normal.      LABORATORY DATA:  I have reviewed the data as listed Lab Results  Component Value Date   WBC 8.1 07/19/2022   HGB 11.7 (L) 07/19/2022   HCT 36.6 07/19/2022   MCV 82.2 07/19/2022   PLT 353 07/19/2022   Recent Labs    02/10/22 0633 04/19/22 1236 06/13/22 1621  06/27/22 1548 07/19/22 1323  NA 135 135 137 137 135  K 3.0* 3.4* 3.9 3.7 3.6  CL 103 104 103 103 104  CO2 24 24 25 27 24   GLUCOSE 153* 126* 105* 94 104*  BUN 12 11 9 11 9   CREATININE 0.70 0.72 0.54 0.68 0.54  CALCIUM 8.3* 9.3 9.5 9.3 9.0  GFRNONAA >60 >60  --   --  >60  PROT  --   --  7.9  --   --   ALBUMIN  --   --  3.9  --   --   AST  --   --  25  --   --   ALT  --   --  16  --   --   ALKPHOS  --   --  46  --   --   BILITOT  --   --  0.5  --   --    Iron/TIBC/Ferritin/ %Sat    Component Value Date/Time   IRON 15 (L) 02/09/2022 1605   TIBC 395 02/09/2022 1605   FERRITIN 20 02/09/2022 1605   IRONPCTSAT 4 (L) 02/09/2022 1605    No results found.  ASSESSMENT & PLAN:   # Anemia-Symptomatic.  Likely due to iron deficiency - from chronic blood loss from menorrhagia/ Iron sat 4; Ferritin-20; Hb 7.5 [FEB 2024] - s/p venofer. Hemoglobin had improved to 12.3, now back down to 11.7. On gentle iron/iron biglycinate; 28 mg daily. Symptomatic. Recommend venofer today and additional 2 doses over next 2 weeks.    #Etiology of iron deficiency: Likely due to menorrhagia d/t fibroids. Followed by Dr. Logan Bores. On Norethindrone. Has not had colonoscopy.    # Hypokalemia: improved; continue Dietary supplementation.    Family Hx of cancer: mom- colon cancer in 57s. She was referred to GI and has appt in August with Dr Allegra Lai.    DISPOSITION: Venofer today 2 additional doses of venofer a week apart 3 mo- lab (cbc, bmp, ferritin, iron studies), Dr Donneta Romberg, +/- venofer- la  No problem-specific Assessment & Plan notes found for this encounter.  All questions were answered. The patient knows to call the clinic with any problems, questions or concerns.   Alinda Dooms, NP 07/19/2022

## 2022-07-26 ENCOUNTER — Inpatient Hospital Stay: Payer: 59

## 2022-07-26 ENCOUNTER — Other Ambulatory Visit: Payer: Self-pay | Admitting: Internal Medicine

## 2022-07-26 VITALS — BP 162/81 | HR 72 | Temp 99.0°F | Resp 18

## 2022-07-26 DIAGNOSIS — D649 Anemia, unspecified: Secondary | ICD-10-CM

## 2022-07-26 DIAGNOSIS — D500 Iron deficiency anemia secondary to blood loss (chronic): Secondary | ICD-10-CM | POA: Diagnosis not present

## 2022-07-26 MED ORDER — SODIUM CHLORIDE 0.9 % IV SOLN
Freq: Once | INTRAVENOUS | Status: AC
  Filled 2022-07-26: qty 250

## 2022-07-26 MED ORDER — SODIUM CHLORIDE 0.9 % IV SOLN
200.0000 mg | Freq: Once | INTRAVENOUS | Status: AC
  Administered 2022-07-26: 200 mg via INTRAVENOUS
  Filled 2022-07-26: qty 200

## 2022-08-02 ENCOUNTER — Encounter: Payer: Self-pay | Admitting: Nurse Practitioner

## 2022-08-02 ENCOUNTER — Inpatient Hospital Stay: Payer: 59

## 2022-08-02 VITALS — BP 155/77 | HR 81 | Temp 97.1°F

## 2022-08-02 DIAGNOSIS — D649 Anemia, unspecified: Secondary | ICD-10-CM

## 2022-08-02 DIAGNOSIS — D500 Iron deficiency anemia secondary to blood loss (chronic): Secondary | ICD-10-CM | POA: Diagnosis not present

## 2022-08-02 MED ORDER — SODIUM CHLORIDE 0.9 % IV SOLN
200.0000 mg | Freq: Once | INTRAVENOUS | Status: AC
  Administered 2022-08-02: 200 mg via INTRAVENOUS
  Filled 2022-08-02: qty 200

## 2022-08-02 MED ORDER — SODIUM CHLORIDE 0.9 % IV SOLN
Freq: Once | INTRAVENOUS | Status: AC
  Filled 2022-08-02: qty 250

## 2022-08-02 NOTE — Patient Instructions (Signed)
Iron Sucrose Injection What is this medication? IRON SUCROSE (EYE ern SOO krose) treats low levels of iron (iron deficiency anemia) in people with kidney disease. Iron is a mineral that plays an important role in making red blood cells, which carry oxygen from your lungs to the rest of your body. This medicine may be used for other purposes; ask your health care provider or pharmacist if you have questions. COMMON BRAND NAME(S): Venofer What should I tell my care team before I take this medication? They need to know if you have any of these conditions: Anemia not caused by low iron levels Heart disease High levels of iron in the blood Kidney disease Liver disease An unusual or allergic reaction to iron, other medications, foods, dyes, or preservatives Pregnant or trying to get pregnant Breastfeeding How should I use this medication? This medication is for infusion into a vein. It is given in a hospital or clinic setting. Talk to your care team about the use of this medication in children. While this medication may be prescribed for children as young as 2 years for selected conditions, precautions do apply. Overdosage: If you think you have taken too much of this medicine contact a poison control center or emergency room at once. NOTE: This medicine is only for you. Do not share this medicine with others. What if I miss a dose? Keep appointments for follow-up doses. It is important not to miss your dose. Call your care team if you are unable to keep an appointment. What may interact with this medication? Do not take this medication with any of the following: Deferoxamine Dimercaprol Other iron products This medication may also interact with the following: Chloramphenicol Deferasirox This list may not describe all possible interactions. Give your health care provider a list of all the medicines, herbs, non-prescription drugs, or dietary supplements you use. Also tell them if you smoke,  drink alcohol, or use illegal drugs. Some items may interact with your medicine. What should I watch for while using this medication? Visit your care team regularly. Tell your care team if your symptoms do not start to get better or if they get worse. You may need blood work done while you are taking this medication. You may need to follow a special diet. Talk to your care team. Foods that contain iron include: whole grains/cereals, dried fruits, beans, or peas, leafy green vegetables, and organ meats (liver, kidney). What side effects may I notice from receiving this medication? Side effects that you should report to your care team as soon as possible: Allergic reactions--skin rash, itching, hives, swelling of the face, lips, tongue, or throat Low blood pressure--dizziness, feeling faint or lightheaded, blurry vision Shortness of breath Side effects that usually do not require medical attention (report to your care team if they continue or are bothersome): Flushing Headache Joint pain Muscle pain Nausea Pain, redness, or irritation at injection site This list may not describe all possible side effects. Call your doctor for medical advice about side effects. You may report side effects to FDA at 1-800-FDA-1088. Where should I keep my medication? This medication is given in a hospital or clinic. It will not be stored at home. NOTE: This sheet is a summary. It may not cover all possible information. If you have questions about this medicine, talk to your doctor, pharmacist, or health care provider.  2024 Elsevier/Gold Standard (2022-06-03 00:00:00)

## 2022-08-04 ENCOUNTER — Encounter: Payer: Self-pay | Admitting: Internal Medicine

## 2022-08-10 ENCOUNTER — Encounter: Payer: Self-pay | Admitting: Nurse Practitioner

## 2022-08-10 ENCOUNTER — Ambulatory Visit (INDEPENDENT_AMBULATORY_CARE_PROVIDER_SITE_OTHER): Payer: 59 | Admitting: Nurse Practitioner

## 2022-08-10 VITALS — BP 148/78 | HR 88 | Temp 98.1°F | Ht 66.0 in | Wt 233.2 lb

## 2022-08-10 DIAGNOSIS — Z6837 Body mass index (BMI) 37.0-37.9, adult: Secondary | ICD-10-CM

## 2022-08-10 DIAGNOSIS — E669 Obesity, unspecified: Secondary | ICD-10-CM | POA: Diagnosis not present

## 2022-08-10 DIAGNOSIS — I10 Essential (primary) hypertension: Secondary | ICD-10-CM | POA: Diagnosis not present

## 2022-08-10 MED ORDER — AMLODIPINE BESYLATE 5 MG PO TABS
5.0000 mg | ORAL_TABLET | Freq: Every day | ORAL | 0 refills | Status: DC
Start: 2022-08-10 — End: 2022-08-16

## 2022-08-10 NOTE — Progress Notes (Signed)
   Established Patient Office Visit  Subjective   Patient ID: Alexis Torres, female    DOB: 04-22-1970  Age: 52 y.o. MRN: [id number]  Chief Complaint  Patient presents with   Hypertension    Pt has received iron infusions for 3 weeks and states BP has remained high.     Hypertension    HTN: patietn is currenlty on omlesartan 40mg . States that she has been gettting iron transfusions and her blood pressure has been eelvelatd    ROS    Objective:     BP (!) 148/78   Pulse 88   Temp 98.1 F (36.7 C) (Temporal)   Ht 5\' 6"  (1.676 m)   Wt 233 lb 3.2 oz (105.8 kg)   LMP 08/01/2022 (Approximate)   SpO2 96%   BMI 37.64 kg/m  BP Readings from Last 3 Encounters:  08/10/22 (!) 148/78  08/02/22 (!) 155/77  07/26/22 (!) 162/81   Wt Readings from Last 3 Encounters:  08/10/22 233 lb 3.2 oz (105.8 kg)  07/19/22 236 lb (107 kg)  06/27/22 234 lb 4 oz (106.3 kg)      Physical Exam Vitals and nursing note reviewed.  Constitutional:      Appearance: Normal appearance.  Cardiovascular:     Rate and Rhythm: Normal rate and regular rhythm.     Heart sounds: Normal heart sounds.  Pulmonary:     Effort: Pulmonary effort is normal.     Breath sounds: Normal breath sounds.  Musculoskeletal:     Right lower leg: No edema.     Left lower leg: No edema.  Neurological:     Mental Status: She is alert.      No results found for any visits on 08/10/22.    The 10-year ASCVD risk score (Arnett DK, et al., 2019) is: 6.8%    Assessment & Plan:   Problem List Items Addressed This Visit       Cardiovascular and Mediastinum   Primary hypertension - Primary    Patient currently maintained omlesartan 40 mg daily.  Blood pressure still goal.  She is been getting iron infusions blood pressure has been above goal every time.  Will add on amlodipine 5 mg daily.  Instructed her she can take that omlesartan in the morning and amlodipine at night to hopefully decrease the possibility  of lower extremity edema.  She will check her blood pressure several times a week at home and send to me via MyChart about 2 weeks.  Follow-up 6 weeks for blood pressure recheck      Relevant Medications   amLODipine (NORVASC) 5 MG tablet     Other   Obesity (BMI 30-39.9)    Return in about 6 weeks (around 09/21/2022) for BP recheck.    Audria Nine, NP

## 2022-08-10 NOTE — Patient Instructions (Signed)
Nice to see you today We are going to continue the [facility] and ADD on the amlodipine Check blood pressure at home 2-3 times a week and once your get several readings send them to me via mychart Follow up in 6 weeks, sooner if you need me

## 2022-08-10 NOTE — Assessment & Plan Note (Signed)
Patient currently maintained omlesartan 40 mg daily.  Blood pressure still goal.  She is been getting iron infusions blood pressure has been above goal every time.  Will add on amlodipine 5 mg daily.  Instructed her she can take that omlesartan in the morning and amlodipine at night to hopefully decrease the possibility of lower extremity edema.  She will check her blood pressure several times a week at home and send to me via MyChart about 2 weeks.  Follow-up 6 weeks for blood pressure recheck

## 2022-08-16 ENCOUNTER — Encounter: Payer: Self-pay | Admitting: Nurse Practitioner

## 2022-08-16 DIAGNOSIS — I10 Essential (primary) hypertension: Secondary | ICD-10-CM

## 2022-08-16 MED ORDER — AMLODIPINE BESYLATE 5 MG PO TABS: 5.0000 mg | ORAL_TABLET | Freq: Every day | ORAL | 0 refills | Status: DC

## 2022-08-24 ENCOUNTER — Encounter: Payer: Self-pay | Admitting: Gastroenterology

## 2022-08-24 ENCOUNTER — Ambulatory Visit (INDEPENDENT_AMBULATORY_CARE_PROVIDER_SITE_OTHER): Payer: 59 | Admitting: Gastroenterology

## 2022-08-24 ENCOUNTER — Other Ambulatory Visit: Payer: Self-pay

## 2022-08-24 VITALS — BP 145/90 | HR 109 | Temp 98.9°F | Ht 66.0 in | Wt 235.2 lb

## 2022-08-24 DIAGNOSIS — D509 Iron deficiency anemia, unspecified: Secondary | ICD-10-CM

## 2022-08-24 MED ORDER — NA SULFATE-K SULFATE-MG SULF 17.5-3.13-1.6 GM/177ML PO SOLN: 354.0000 mL | Freq: Once | ORAL | 0 refills | Status: AC

## 2022-08-24 NOTE — Progress Notes (Signed)
Arlyss Repress, MD [address]  [address]  [place], [place] [address]  Main: [phone]  Fax: [phone]    Gastroenterology Consultation  Referring Provider:     Henreitta Leber, PA-C Primary Care Physician:  Eden Emms, NP Primary Gastroenterologist:  Dr. Arlyss Repress Reason for Consultation: Iron deficiency anemia        HPI:   Alexis Torres is a 52 y.o. female referred by Eden Emms, NP  for consultation & management of iron deficiency anemia.  Patient has a microcytic anemia since 05/2014.  She was found to have severe iron deficiency anemia in January 2024, hemoglobin 6.6, received blood transfusion as well as parenteral iron therapy, her hemoglobin returned normal.  Most recently, slightly dropped to 11.7 on 07/19/2022.  Scheduled to receive more IV iron.  She reported having heavy menstrual cycles and generalized weakness when she was admitted to [facility] at this time.  She is currently on oral hormonal therapy which is regulating her menstrual cycles.  She does drink Coke daily, cut back to 1 to 2 cans a day.  She denies any GI symptoms  NSAIDs: None  Antiplts/Anticoagulants/Anti thrombotics: None  GI Procedures: None Her mother with colon cancer in her 51s  Past Medical History:  Diagnosis Date   Anemia    Blood transfusion without reported diagnosis February   Hypertension February    Past Surgical History:  Procedure Laterality Date   WISDOM TOOTH EXTRACTION       Current Outpatient Medications:    amLODipine (NORVASC) 5 MG tablet, Take 1 tablet (5 mg total) by mouth daily., Disp: 90 tablet, Rfl: 0   Na Sulfate-K Sulfate-Mg Sulf 17.5-3.13-1.6 GM/177ML SOLN, Take 354 mLs by mouth once for 1 dose., Disp: 354 mL, Rfl: 0   norethindrone (MICRONOR) 0.35 MG tablet, Take 1 tablet (0.35 mg total) by mouth daily., Disp: 84 tablet, Rfl: 1   olmesartan (BENICAR) 40 MG tablet, Take 1 tablet (40 mg total) by mouth daily., Disp: 90 tablet, Rfl:  0   Family History  Problem Relation Age of Onset   Cancer Mother        COLON   Stomach cancer Mother    Heart disease Father    Lung cancer Father    Cancer Father    Seizures Sister        EPILEPSY   Hypertension Paternal Aunt    Hypertension Cousin      Social History   Tobacco Use   Smoking status: Former    Current packs/day: 0.00    Types: Cigarettes, Cigars    Quit date: 04/25/2019    Years since quitting: 3.3   Smokeless tobacco: Never   Tobacco comments:    QUIT IN DEC.  Vaping Use   Vaping status: Never Used  Substance Use Topics   Alcohol use: No   Drug use: No    Allergies as of 08/24/2022   (No Known Allergies)    Review of Systems:    All systems reviewed and negative except where noted in HPI.   Physical Exam:  BP (!) 145/90 (BP Location: Right Arm, Patient Position: Sitting, Cuff Size: Large)   Pulse (!) 109   Temp 98.9 F (37.2 C) (Oral)   Ht 5\' 6"  (1.676 m)   Wt 235 lb 4 oz (106.7 kg)   LMP 08/01/2022 (Approximate)   BMI 37.97 kg/m  Patient's last menstrual period was 08/01/2022 (approximate).  General:   Alert,  Well-developed, well-nourished, pleasant and cooperative in NAD Head:  Normocephalic and atraumatic. Eyes:  Sclera clear, no icterus.   Conjunctiva pink. Ears:  Normal auditory acuity. Nose:  No deformity, discharge, or lesions. Mouth:  No deformity or lesions,oropharynx pink & moist. Neck:  Supple; no masses or thyromegaly. Lungs:  Respirations even and unlabored.  Clear throughout to auscultation.   No wheezes, crackles, or rhonchi. No acute distress. Heart:  Regular rate and rhythm; no murmurs, clicks, rubs, or gallops. Abdomen:  Normal bowel sounds. Soft, non-tender and non-distended without masses, hepatosplenomegaly or hernias noted.  No guarding or rebound tenderness.   Rectal: Not performed Msk:  Symmetrical without gross deformities. Good, equal movement & strength bilaterally. Pulses:  Normal pulses  noted. Extremities:  No clubbing or edema.  No cyanosis. Neurologic:  Alert and oriented x3;  grossly normal neurologically. Skin:  Intact without significant lesions or rashes. No jaundice. Psych:  Alert and cooperative. Normal mood and affect.  Imaging Studies: Reviewed  Assessment and Plan:   Alexis Torres is a 52 y.o. pleasant female with obesity, hypertension, menorrhagia, chronic iron deficiency anemia  Chronic iron deficiency anemia: Likely secondary to menorrhagia However given her age and family history, Recommend EGD and colonoscopy for further evaluation Advised her to completely eliminate sugary drinks, sweets, carbonated beverages, follow healthy diet and exercise  I have discussed alternative options, risks & benefits,  which include, but are not limited to, bleeding, infection, perforation,respiratory complication & drug reaction.  The patient agrees with this plan & written consent will be obtained.     Follow up based on the above workup   Arlyss Repress, MD

## 2022-08-25 ENCOUNTER — Encounter (INDEPENDENT_AMBULATORY_CARE_PROVIDER_SITE_OTHER): Payer: Self-pay

## 2022-09-16 ENCOUNTER — Encounter: Payer: Self-pay | Admitting: Pharmacist

## 2022-09-21 ENCOUNTER — Encounter: Payer: Self-pay | Admitting: Nurse Practitioner

## 2022-09-21 ENCOUNTER — Ambulatory Visit: Payer: 59 | Admitting: Nurse Practitioner

## 2022-09-21 VITALS — BP 150/78 | HR 89 | Temp 98.3°F | Ht 66.0 in | Wt 236.4 lb

## 2022-09-21 DIAGNOSIS — Z6838 Body mass index (BMI) 38.0-38.9, adult: Secondary | ICD-10-CM | POA: Diagnosis not present

## 2022-09-21 DIAGNOSIS — Z23 Encounter for immunization: Secondary | ICD-10-CM | POA: Diagnosis not present

## 2022-09-21 DIAGNOSIS — E669 Obesity, unspecified: Secondary | ICD-10-CM | POA: Diagnosis not present

## 2022-09-21 DIAGNOSIS — I10 Essential (primary) hypertension: Secondary | ICD-10-CM

## 2022-09-21 NOTE — Assessment & Plan Note (Signed)
Patient currently maintained on omelesartan 40 mg daily and amlodipine to 5 mg daily.  Patient tolerating medications well blood pressure was under control we will hold medication the same give patient a chance to work on lifestyle modifications at this juncture.

## 2022-09-21 NOTE — Assessment & Plan Note (Signed)
Continue working healthy lifestyle modification inclusive of diet and exercise.

## 2022-09-21 NOTE — Patient Instructions (Signed)
Nice to see you today We did update your flu vaccine today Follow up with e in 3 months, sooner if you need me Check blood pressure weekly. I want the top number to be below 140 and the bottom number below 90 consistently

## 2022-09-21 NOTE — Progress Notes (Signed)
   Established Patient Office Visit  Subjective   Patient ID: Alexis Torres, female    DOB: 1970-03-21  Age: 52 y.o. MRN: [id number]  Chief Complaint  Patient presents with   Follow-up    Pt complains of BP recheck. States that she had a doctors appt 2 weeks ago BP 149/90.     HPI  HTN: Patient currently maintained on olmesartan 40 mg and amlodipine 5 mg.  Patient was getting iron infusions through the cancer center when they noticed that her blood pressure was elevated she came to the office and we added on amlodipine 5 mg.  Patient is here for recheck  States that she feels like she is doing good.   States that she does not have a machine at home. States that she   Statesa that hse is walking but not as much. States that she is trying to get 5-7K steps a day     ROS    Objective:     BP (!) 150/78   Pulse 89   Temp 98.3 F (36.8 C) (Temporal)   Ht 5\' 6"  (1.676 m)   Wt 236 lb 6.4 oz (107.2 kg)   SpO2 96%   BMI 38.16 kg/m  BP Readings from Last 3 Encounters:  09/21/22 (!) 150/78  08/24/22 (!) 145/90  08/10/22 (!) 148/78   Wt Readings from Last 3 Encounters:  09/21/22 236 lb 6.4 oz (107.2 kg)  08/24/22 235 lb 4 oz (106.7 kg)  08/10/22 233 lb 3.2 oz (105.8 kg)      Physical Exam Vitals and nursing note reviewed.  Constitutional:      Appearance: Normal appearance.  Cardiovascular:     Rate and Rhythm: Normal rate and regular rhythm.     Heart sounds: Normal heart sounds.  Pulmonary:     Effort: Pulmonary effort is normal.     Breath sounds: Normal breath sounds.  Musculoskeletal:     Right lower leg: No edema.     Left lower leg: No edema.  Neurological:     Mental Status: She is alert.      No results found for any visits on 09/21/22.    The 10-year ASCVD risk score (Arnett DK, et al., 2019) is: 7.4%    Assessment & Plan:   Problem List Items Addressed This Visit       Cardiovascular and Mediastinum   Primary hypertension - Primary     Patient currently maintained on omelesartan 40 mg daily and amlodipine to 5 mg daily.  Patient tolerating medications well blood pressure was under control we will hold medication the same give patient a chance to work on lifestyle modifications at this juncture.        Other   Obesity (BMI 30-39.9)    Continue working healthy lifestyle modification inclusive of diet and exercise.      Other Visit Diagnoses     Flu vaccine need           Return in about 3 months (around 12/21/2022) for BP recheck.    Audria Nine, NP

## 2022-09-26 ENCOUNTER — Other Ambulatory Visit: Payer: Self-pay | Admitting: Nurse Practitioner

## 2022-09-30 ENCOUNTER — Telehealth: Payer: Self-pay

## 2022-09-30 ENCOUNTER — Other Ambulatory Visit: Payer: Self-pay | Admitting: Obstetrics and Gynecology

## 2022-09-30 DIAGNOSIS — N938 Other specified abnormal uterine and vaginal bleeding: Secondary | ICD-10-CM

## 2022-09-30 DIAGNOSIS — D219 Benign neoplasm of connective and other soft tissue, unspecified: Secondary | ICD-10-CM

## 2022-09-30 MED ORDER — NORETHINDRONE 0.35 MG PO TABS
1.0000 | ORAL_TABLET | Freq: Every day | ORAL | 0 refills | Status: DC
Start: 2022-09-30 — End: 2022-12-27

## 2022-09-30 NOTE — Telephone Encounter (Signed)
Patient calling in to request a refill on her birth control. Patient is aware she is overdue for an annual. The October schedule is currently unavailable, instructed to call back next week to schedule an appt. One additional refill sent of Micronor.

## 2022-10-05 ENCOUNTER — Encounter: Payer: Self-pay | Admitting: Internal Medicine

## 2022-10-11 ENCOUNTER — Telehealth: Payer: Self-pay

## 2022-10-11 NOTE — Telephone Encounter (Signed)
Patient is calling today because she states that she called and left a message on this number voicemail the [phone] to cancel her procedure on 10/13/2022 with Dr. Allegra Lai and no one called her back so she wants to make sure the procedure is canceled. Informed her it is not canceled but I could get it canceled. I asked her if she wanted to reschedule. She said at this time she does not because she received a call it was going to cost 2000 dollars and she can not afford that. Informed her that they call everyone and give them a estimate cost and it scares everyone because they tell them they have to pay it at the day of the procedure. Informed her she does not she does not have to pay anything that she will get a bill after the procedure and set payment plans. She states that makes her feel much better she will keep the procedure. Informed her I would still let my office manger know about no one call her back last week and I was really sorry about that. She stated it was okay and she appreciate letting management know and explaining everything to her.

## 2022-10-12 NOTE — Telephone Encounter (Signed)
Patient states she needs to cancel because her boss will not let her off work. She states she will call back when she talks to him to see when he will let her off and look at her calendar

## 2022-10-12 NOTE — Telephone Encounter (Signed)
Patient called in because she needs to cancel her colonoscopy due to work.

## 2022-10-13 ENCOUNTER — Ambulatory Visit: Admission: RE | Admit: 2022-10-13 | Payer: 59 | Source: Home / Self Care | Admitting: Gastroenterology

## 2022-10-13 SURGERY — COLONOSCOPY WITH PROPOFOL
Anesthesia: General

## 2022-10-18 ENCOUNTER — Other Ambulatory Visit: Payer: Self-pay | Admitting: *Deleted

## 2022-10-18 DIAGNOSIS — D500 Iron deficiency anemia secondary to blood loss (chronic): Secondary | ICD-10-CM

## 2022-10-19 ENCOUNTER — Inpatient Hospital Stay (HOSPITAL_BASED_OUTPATIENT_CLINIC_OR_DEPARTMENT_OTHER): Payer: 59 | Admitting: Internal Medicine

## 2022-10-19 ENCOUNTER — Encounter: Payer: Self-pay | Admitting: Internal Medicine

## 2022-10-19 ENCOUNTER — Inpatient Hospital Stay: Payer: 59 | Attending: Internal Medicine

## 2022-10-19 ENCOUNTER — Inpatient Hospital Stay: Payer: 59

## 2022-10-19 VITALS — BP 155/80 | HR 85

## 2022-10-19 DIAGNOSIS — Z801 Family history of malignant neoplasm of trachea, bronchus and lung: Secondary | ICD-10-CM | POA: Diagnosis not present

## 2022-10-19 DIAGNOSIS — E876 Hypokalemia: Secondary | ICD-10-CM | POA: Diagnosis not present

## 2022-10-19 DIAGNOSIS — Z820 Family history of epilepsy and other diseases of the nervous system: Secondary | ICD-10-CM | POA: Insufficient documentation

## 2022-10-19 DIAGNOSIS — Z79899 Other long term (current) drug therapy: Secondary | ICD-10-CM | POA: Diagnosis not present

## 2022-10-19 DIAGNOSIS — I10 Essential (primary) hypertension: Secondary | ICD-10-CM | POA: Insufficient documentation

## 2022-10-19 DIAGNOSIS — N920 Excessive and frequent menstruation with regular cycle: Secondary | ICD-10-CM | POA: Insufficient documentation

## 2022-10-19 DIAGNOSIS — Z87891 Personal history of nicotine dependence: Secondary | ICD-10-CM | POA: Insufficient documentation

## 2022-10-19 DIAGNOSIS — D500 Iron deficiency anemia secondary to blood loss (chronic): Secondary | ICD-10-CM | POA: Diagnosis present

## 2022-10-19 DIAGNOSIS — D259 Leiomyoma of uterus, unspecified: Secondary | ICD-10-CM | POA: Diagnosis not present

## 2022-10-19 DIAGNOSIS — D649 Anemia, unspecified: Secondary | ICD-10-CM

## 2022-10-19 DIAGNOSIS — Z800 Family history of malignant neoplasm of digestive organs: Secondary | ICD-10-CM | POA: Insufficient documentation

## 2022-10-19 DIAGNOSIS — Z8249 Family history of ischemic heart disease and other diseases of the circulatory system: Secondary | ICD-10-CM | POA: Diagnosis not present

## 2022-10-19 DIAGNOSIS — R5383 Other fatigue: Secondary | ICD-10-CM | POA: Insufficient documentation

## 2022-10-19 LAB — CBC WITH DIFFERENTIAL (CANCER CENTER ONLY)
Abs Immature Granulocytes: 0.04 10*3/uL (ref 0.00–0.07)
Basophils Absolute: 0 10*3/uL (ref 0.0–0.1)
Basophils Relative: 0 %
Eosinophils Absolute: 0.2 10*3/uL (ref 0.0–0.5)
Eosinophils Relative: 3 %
HCT: 36.6 % (ref 36.0–46.0)
Hemoglobin: 11.9 g/dL — ABNORMAL LOW (ref 12.0–15.0)
Immature Granulocytes: 1 %
Lymphocytes Relative: 20 %
Lymphs Abs: 1.4 10*3/uL (ref 0.7–4.0)
MCH: 26.9 pg (ref 26.0–34.0)
MCHC: 32.5 g/dL (ref 30.0–36.0)
MCV: 82.8 fL (ref 80.0–100.0)
Monocytes Absolute: 0.6 10*3/uL (ref 0.1–1.0)
Monocytes Relative: 9 %
Neutro Abs: 4.8 10*3/uL (ref 1.7–7.7)
Neutrophils Relative %: 67 %
Platelet Count: 371 10*3/uL (ref 150–400)
RBC: 4.42 MIL/uL (ref 3.87–5.11)
RDW: 13.3 % (ref 11.5–15.5)
WBC Count: 7.1 10*3/uL (ref 4.0–10.5)
nRBC: 0 % (ref 0.0–0.2)

## 2022-10-19 LAB — BASIC METABOLIC PANEL - CANCER CENTER ONLY
Anion gap: 6 (ref 5–15)
BUN: 9 mg/dL (ref 6–20)
CO2: 24 mmol/L (ref 22–32)
Calcium: 9.5 mg/dL (ref 8.9–10.3)
Chloride: 105 mmol/L (ref 98–111)
Creatinine: 0.6 mg/dL (ref 0.44–1.00)
GFR, Estimated: >60 mL/min (ref >60)
Glucose, Bld: 100 mg/dL — ABNORMAL HIGH (ref 70–99)
Potassium: 3.4 mmol/L — ABNORMAL LOW (ref 3.5–5.1)
Sodium: 135 mmol/L (ref 135–145)

## 2022-10-19 LAB — IRON AND TIBC
Iron: 68 ug/dL (ref 28–170)
Saturation Ratios: 20 % (ref 10.4–31.8)
TIBC: 339 ug/dL (ref 250–450)
UIBC: 271 ug/dL

## 2022-10-19 LAB — FERRITIN: Ferritin: 68 ng/mL (ref 11–307)

## 2022-10-19 MED ORDER — SODIUM CHLORIDE 0.9 % IV SOLN
Freq: Once | INTRAVENOUS | Status: AC
  Filled 2022-10-19: qty 250

## 2022-10-19 MED ORDER — SODIUM CHLORIDE 0.9 % IV SOLN
200.0000 mg | Freq: Once | INTRAVENOUS | Status: AC
  Administered 2022-10-19: 200 mg via INTRAVENOUS
  Filled 2022-10-19: qty 200

## 2022-10-19 MED ORDER — SODIUM CHLORIDE 0.9% FLUSH
10.0000 mL | Freq: Once | INTRAVENOUS | Status: AC | PRN
  Administered 2022-10-19: 10 mL
  Filled 2022-10-19: qty 10

## 2022-10-19 NOTE — Progress Notes (Signed)
Patient tolerated Venofer infusion well. Explained recommendation of 30 min post monitoring. Patient refused to wait post monitoring. Educated on what signs to watch for & to call with any concerns. No questions, discharged. Stable  

## 2022-10-19 NOTE — Progress Notes (Signed)
[facility] CONSULT NOTE  Patient Care Team: Eden Emms, NP as PCP - General (Nurse Practitioner) Earna Coder, MD as Consulting Physician (Oncology)  CHIEF COMPLAINTS/PURPOSE OF CONSULTATION: ANEMIA  HEMATOLOGY HISTORY  # ANEMIA[Hb; MCV-platelets- WBC; Iron sat; ferritin;  GFR- CT/US- ;  EGD/colonoscopy-NONE  HISTORY OF PRESENTING ILLNESS: Patient ambulating-independently.  Alone.  Alexis Torres 52 y.o.  female pleasant patient with severe iron deficiency anemia likely secondary to menorrhagia is here for follow-up.  In the interim patient underwent IV iron infusion.  Infusions uneventfully.  Patient has a longstanding history of menorrhagia which is attributed to her fibroids.  Currently before.  She is currently on oral iron twice a day.  No abdominal discomfort.   Patient has canceled her GI appointments for regarding screening colonoscopy.   Review of Systems  Constitutional:  Positive for malaise/fatigue. Negative for chills, diaphoresis, fever and weight loss.  HENT:  Negative for nosebleeds and sore throat.   Eyes:  Negative for double vision.  Respiratory:  Negative for hemoptysis, sputum production, shortness of breath and wheezing.   Cardiovascular:  Negative for chest pain, palpitations, orthopnea and leg swelling.  Gastrointestinal:  Negative for abdominal pain, blood in stool, constipation, diarrhea, heartburn, melena, nausea and vomiting.  Genitourinary:  Negative for dysuria, frequency and urgency.  Musculoskeletal:  Negative for back pain and joint pain.  Skin: Negative.  Negative for itching and rash.  Neurological:  Negative for dizziness, tingling, focal weakness, weakness and headaches.  Endo/Heme/Allergies:  Does not bruise/bleed easily.  Psychiatric/Behavioral:  Negative for depression. The patient is not nervous/anxious and does not have insomnia.      MEDICAL HISTORY:  Past Medical History:  Diagnosis Date   Anemia     Blood transfusion without reported diagnosis February   Hypertension February    SURGICAL HISTORY: Past Surgical History:  Procedure Laterality Date   WISDOM TOOTH EXTRACTION      SOCIAL HISTORY: Social History   Socioeconomic History   Marital status: Single    Spouse name: Not on file   Number of children: 1   Years of education: Not on file   Highest education level: Associate degree: occupational, Scientist, product/process development, or vocational program  Occupational History   Not on file  Tobacco Use   Smoking status: Former    Current packs/day: 0.00    Types: Cigarettes, Cigars    Quit date: 04/25/2019    Years since quitting: 3.4   Smokeless tobacco: Never   Tobacco comments:    QUIT IN DEC.  Vaping Use   Vaping status: Never Used  Substance and Sexual Activity   Alcohol use: No   Drug use: No   Sexual activity: Not Currently    Birth control/protection: Abstinence, Pill  Other Topics Concern   Not on file  Social History Narrative   Fulltime: customer service rep      [employer] (29)      Hobbies: reading, Herb planting,    Social Determinants of Health   Financial Resource Strain: Low Risk  (06/24/2022)   Overall Financial Resource Strain (CARDIA)    Difficulty of Paying Living Expenses: Not very hard  Food Insecurity: No Food Insecurity (06/24/2022)   Hunger Vital Sign    Worried About Running Out of Food in the Last Year: Never true    Ran Out of Food in the Last Year: Never true  Transportation Needs: No Transportation Needs (06/24/2022)   PRAPARE - Transportation    Lack of Transportation (  Medical): No    Lack of Transportation (Non-Medical): No  Physical Activity: Insufficiently Active (06/24/2022)   Exercise Vital Sign    Days of Exercise per Week: 3 days    Minutes of Exercise per Session: 30 min  Stress: No Stress Concern Present (06/24/2022)   [employer] of Occupational Health - Occupational Stress Questionnaire    Feeling of Stress : Not at all  Social  Connections: Moderately Isolated (06/24/2022)   Social Connection and Isolation Panel [NHANES]    Frequency of Communication with Friends and Family: Three times a week    Frequency of Social Gatherings with Friends and Family: Once a week    Attends Religious Services: 1 to 4 times per year    Active Member of [employer] or Organizations: No    Attends Engineer, structural: Not on file    Marital Status: Never married  Intimate Partner Violence: Not At Risk (02/16/2022)   Humiliation, Afraid, Rape, and Kick questionnaire    Fear of Current or Ex-Partner: No    Emotionally Abused: No    Physically Abused: No    Sexually Abused: No    FAMILY HISTORY: Family History  Problem Relation Age of Onset   Cancer Mother        COLON   Stomach cancer Mother    Heart disease Father    Lung cancer Father    Cancer Father    Seizures Sister        EPILEPSY   Hypertension Paternal Aunt    Hypertension Cousin     ALLERGIES:  has No Known Allergies.  MEDICATIONS:  Current Outpatient Medications  Medication Sig Dispense Refill   amLODipine (NORVASC) 5 MG tablet Take 1 tablet (5 mg total) by mouth daily. 90 tablet 0   norethindrone (MICRONOR) 0.35 MG tablet Take 1 tablet (0.35 mg total) by mouth daily. 84 tablet 0   olmesartan (BENICAR) 40 MG tablet TAKE 1 TABLET BY MOUTH EVERY DAY 90 tablet 0   No current facility-administered medications for this visit.     Marland Kitchen  PHYSICAL EXAMINATION:   Vitals:   10/19/22 1254 10/19/22 1309  BP: (!) 169/86 (!) 167/80  Pulse: 81   Temp: 97.9 F (36.6 C)   SpO2: 98%    Filed Weights   10/19/22 1254  Weight: 235 lb 12.8 oz (107 kg)    Physical Exam Vitals and nursing note reviewed.  HENT:     Head: Normocephalic and atraumatic.     Mouth/Throat:     Pharynx: Oropharynx is clear.  Eyes:     Extraocular Movements: Extraocular movements intact.     Pupils: Pupils are equal, round, and reactive to light.  Cardiovascular:     Rate and  Rhythm: Normal rate and regular rhythm.  Pulmonary:     Comments: Decreased breath sounds bilaterally.  Abdominal:     Palpations: Abdomen is soft.  Musculoskeletal:        General: Normal range of motion.     Cervical back: Normal range of motion.  Skin:    General: Skin is warm.  Neurological:     General: No focal deficit present.     Mental Status: She is alert and oriented to person, place, and time.  Psychiatric:        Behavior: Behavior normal.        Judgment: Judgment normal.      LABORATORY DATA:  I have reviewed the data as listed Lab Results  Component Value Date  WBC 7.1 10/19/2022   HGB 11.9 (L) 10/19/2022   HCT 36.6 10/19/2022   MCV 82.8 10/19/2022   PLT 371 10/19/2022   Recent Labs    04/19/22 1236 06/13/22 1621 06/27/22 1548 07/19/22 1323 10/19/22 1244  NA 135 137 137 135 135  K 3.4* 3.9 3.7 3.6 3.4*  CL 104 103 103 104 105  CO2 24 25 27 24 24   GLUCOSE 126* 105* 94 104* 100*  BUN 11 9 11 9 9   CREATININE 0.72 0.54 0.68 0.54 0.60  CALCIUM 9.3 9.5 9.3 9.0 9.5  GFRNONAA >60  --   --  >60 >60  PROT  --  7.9  --   --   --   ALBUMIN  --  3.9  --   --   --   AST  --  25  --   --   --   ALT  --  16  --   --   --   ALKPHOS  --  46  --   --   --   BILITOT  --  0.5  --   --   --      No results found.  ASSESSMENT & PLAN:   Symptomatic anemia # Anemia-Symptomatic.  Likely due to iron deficiency - from chronic blood loss from menorrhagia/ Iron sat 4; Ferritin-20; Hb 7.5 [FEB 2024] - s/p venofer.   # Hb 11.8 ; normal.  On PO iron twice a day; no gastric distress. Proceed with venofer today.   #Etiology of iron deficiency: Likely from underlying menorrhagiahistory of menorrhagia which is attributed to her fibroids- not any worse.  Saw a OB GYN, pt wants trial of OCPs prior to hysterectomy. Recommend GI regarding screening colonoscopy- .  Patient did not have any colonoscopy yet- recommend scheduling it.   # Hypokalemia: improved; continue Dietary  supplementation.   Family Hx of cancer: mom- colon cancer in 65s; recommend GI evaluation.   # DISPOSITION: # venofer  # follow up in 4 months MD: labs-cbc/bmp; iron studies; ferritin possible venofer-Dr.B   All questions were answered. The patient knows to call the clinic with any problems, questions or concerns.    Earna Coder, MD 10/19/2022 1:55 PM

## 2022-10-19 NOTE — Progress Notes (Signed)
Fatigue/weakness: no Dyspena: no Light headedness: no Blood in stool:  no 

## 2022-10-19 NOTE — Assessment & Plan Note (Addendum)
#   Anemia-Symptomatic.  Likely due to iron deficiency - from chronic blood loss from menorrhagia/ Iron sat 4; Ferritin-20; Hb 7.5 [FEB 2024] - s/p venofer.   # Hb 11.8 ; normal.  On PO iron twice a day; no gastric distress. Proceed with venofer today.   #Etiology of iron deficiency: Likely from underlying menorrhagiahistory of menorrhagia which is attributed to her fibroids- not any worse.  Saw a OB GYN, pt wants trial of OCPs prior to hysterectomy. Recommend GI regarding screening colonoscopy- .  Patient did not have any colonoscopy yet- recommend scheduling it.   # Hypokalemia: improved; continue Dietary supplementation.   Family Hx of cancer: mom- colon cancer in 39s; recommend GI evaluation.   # DISPOSITION: # venofer  # follow up in 4 months MD: labs-cbc/bmp; iron studies; ferritin possible venofer-Dr.B

## 2022-10-19 NOTE — Patient Instructions (Signed)
Iron Sucrose Injection What is this medication? IRON SUCROSE (EYE ern SOO krose) treats low levels of iron (iron deficiency anemia) in people with kidney disease. Iron is a mineral that plays an important role in making red blood cells, which carry oxygen from your lungs to the rest of your body. This medicine may be used for other purposes; ask your health care provider or pharmacist if you have questions. COMMON BRAND NAME(S): Venofer What should I tell my care team before I take this medication? They need to know if you have any of these conditions: Anemia not caused by low iron levels Heart disease High levels of iron in the blood Kidney disease Liver disease An unusual or allergic reaction to iron, other medications, foods, dyes, or preservatives Pregnant or trying to get pregnant Breastfeeding How should I use this medication? This medication is for infusion into a vein. It is given in a hospital or clinic setting. Talk to your care team about the use of this medication in children. While this medication may be prescribed for children as young as 2 years for selected conditions, precautions do apply. Overdosage: If you think you have taken too much of this medicine contact a poison control center or emergency room at once. NOTE: This medicine is only for you. Do not share this medicine with others. What if I miss a dose? Keep appointments for follow-up doses. It is important not to miss your dose. Call your care team if you are unable to keep an appointment. What may interact with this medication? Do not take this medication with any of the following: Deferoxamine Dimercaprol Other iron products This medication may also interact with the following: Chloramphenicol Deferasirox This list may not describe all possible interactions. Give your health care provider a list of all the medicines, herbs, non-prescription drugs, or dietary supplements you use. Also tell them if you smoke,  drink alcohol, or use illegal drugs. Some items may interact with your medicine. What should I watch for while using this medication? Visit your care team regularly. Tell your care team if your symptoms do not start to get better or if they get worse. You may need blood work done while you are taking this medication. You may need to follow a special diet. Talk to your care team. Foods that contain iron include: whole grains/cereals, dried fruits, beans, or peas, leafy green vegetables, and organ meats (liver, kidney). What side effects may I notice from receiving this medication? Side effects that you should report to your care team as soon as possible: Allergic reactions--skin rash, itching, hives, swelling of the face, lips, tongue, or throat Low blood pressure--dizziness, feeling faint or lightheaded, blurry vision Shortness of breath Side effects that usually do not require medical attention (report to your care team if they continue or are bothersome): Flushing Headache Joint pain Muscle pain Nausea Pain, redness, or irritation at injection site This list may not describe all possible side effects. Call your doctor for medical advice about side effects. You may report side effects to FDA at 1-800-FDA-1088. Where should I keep my medication? This medication is given in a hospital or clinic. It will not be stored at home. NOTE: This sheet is a summary. It may not cover all possible information. If you have questions about this medicine, talk to your doctor, pharmacist, or health care provider.  2024 Elsevier/Gold Standard (2022-06-03 00:00:00)

## 2022-11-03 ENCOUNTER — Encounter: Payer: Self-pay | Admitting: Nurse Practitioner

## 2022-11-03 DIAGNOSIS — I10 Essential (primary) hypertension: Secondary | ICD-10-CM

## 2022-11-11 ENCOUNTER — Other Ambulatory Visit: Payer: Self-pay | Admitting: Nurse Practitioner

## 2022-11-11 DIAGNOSIS — I10 Essential (primary) hypertension: Secondary | ICD-10-CM

## 2022-11-25 MED ORDER — HYDROCHLOROTHIAZIDE 25 MG PO TABS
25.0000 mg | ORAL_TABLET | Freq: Every day | ORAL | 1 refills | Status: DC
Start: 2022-11-25 — End: 2023-01-19

## 2022-11-25 NOTE — Addendum Note (Signed)
Addended by: Eden Emms on: 11/25/2022 01:51 PM   Modules accepted: Orders

## 2022-11-25 NOTE — Telephone Encounter (Signed)
Needs appt within 30 days for HTN and kidney recheck

## 2022-12-05 ENCOUNTER — Ambulatory Visit: Payer: 59 | Admitting: Nurse Practitioner

## 2022-12-07 ENCOUNTER — Ambulatory Visit: Payer: 59 | Admitting: Nurse Practitioner

## 2022-12-21 ENCOUNTER — Encounter: Payer: Self-pay | Admitting: Nurse Practitioner

## 2022-12-21 ENCOUNTER — Ambulatory Visit: Payer: 59 | Admitting: Nurse Practitioner

## 2022-12-21 ENCOUNTER — Other Ambulatory Visit (HOSPITAL_COMMUNITY)
Admission: RE | Admit: 2022-12-21 | Discharge: 2022-12-21 | Disposition: A | Discharge status: Home / Self Care | Payer: 59 | Source: Ambulatory Visit | Attending: Nurse Practitioner | Admitting: Nurse Practitioner

## 2022-12-21 ENCOUNTER — Other Ambulatory Visit: Payer: Self-pay | Admitting: Obstetrics and Gynecology

## 2022-12-21 VITALS — BP 118/78 | HR 80 | Temp 98.6°F | Ht 66.0 in | Wt 234.0 lb

## 2022-12-21 DIAGNOSIS — I10 Essential (primary) hypertension: Secondary | ICD-10-CM

## 2022-12-21 DIAGNOSIS — Z124 Encounter for screening for malignant neoplasm of cervix: Secondary | ICD-10-CM

## 2022-12-21 DIAGNOSIS — D219 Benign neoplasm of connective and other soft tissue, unspecified: Secondary | ICD-10-CM

## 2022-12-21 DIAGNOSIS — Z01419 Encounter for gynecological examination (general) (routine) without abnormal findings: Secondary | ICD-10-CM

## 2022-12-21 DIAGNOSIS — L732 Hidradenitis suppurativa: Secondary | ICD-10-CM

## 2022-12-21 DIAGNOSIS — N938 Other specified abnormal uterine and vaginal bleeding: Secondary | ICD-10-CM

## 2022-12-21 MED ORDER — OLMESARTAN MEDOXOMIL 20 MG PO TABS
20.0000 mg | ORAL_TABLET | Freq: Every day | ORAL | 1 refills | Status: DC
Start: 2022-12-21 — End: 2023-06-14

## 2022-12-21 MED ORDER — DOXYCYCLINE HYCLATE 100 MG PO TABS
100.0000 mg | ORAL_TABLET | Freq: Two times a day (BID) | ORAL | 0 refills | Status: AC
Start: 2022-12-21 — End: 2022-12-31

## 2022-12-21 NOTE — Progress Notes (Signed)
Established Patient Office Visit  Subjective   Patient ID: Alexis Torres, female    DOB: 1970/04/16  Age: 52 y.o. MRN: [id number]  Chief Complaint  Patient presents with   Hypertension    BP reading this morning 145/84.    Gynecologic Exam      Hypertension: Patient currently maintained on olmesartan 40 mg amlodipine 5 mg hydrochlorothiazide 25 mg.  Patient did reach out to me via MyChart saying that she was having some swelling to her legs.  Patient was seen in a diuretic along with continuing without olmesartan.  Patient stopped taking amlodipine. States that she stopped taking the omlesartan too. States that she was still having some trouble swelling. States that she  has been off the omlesartan   133 is the lowest. Average readings is 140-145.  States that she has had the bumps under her bilateral armpits. States that it will leak fluid. Does not hurt. Have not changed in size. States that she is not sure as to what it is. States that I thas been tender and leaked pus.     Review of Systems  Constitutional:  Negative for chills and fever.  Respiratory:  Negative for shortness of breath.   Cardiovascular:  Negative for chest pain and leg swelling.  Neurological:  Negative for dizziness and headaches.  Psychiatric/Behavioral:  Negative for hallucinations and suicidal ideas.       Objective:     BP 118/78 (BP Location: Left Arm, Patient Position: Sitting, Cuff Size: Large)   Pulse 80   Temp 98.6 F (37 C) (Oral)   Ht 5\' 6"  (1.676 m)   Wt 234 lb (106.1 kg)   SpO2 99%   BMI 37.77 kg/m  BP Readings from Last 3 Encounters:  12/21/22 118/78  10/19/22 (!) 155/80  10/19/22 (!) 167/80   Wt Readings from Last 3 Encounters:  12/21/22 234 lb (106.1 kg)  10/19/22 235 lb 12.8 oz (107 kg)  09/21/22 236 lb 6.4 oz (107.2 kg)   SpO2 Readings from Last 3 Encounters:  12/21/22 99%  10/19/22 98%  09/21/22 96%      Physical Exam Vitals and nursing note reviewed. Exam  conducted with a chaperone present [employer], CMA).  Constitutional:      Appearance: Normal appearance.  Cardiovascular:     Rate and Rhythm: Normal rate and regular rhythm.     Heart sounds: Normal heart sounds.  Pulmonary:     Effort: Pulmonary effort is normal.     Breath sounds: Normal breath sounds.  Genitourinary:    Exam position: Lithotomy position.     Vagina: Normal.     Cervix: Normal.     Uterus: Normal.      Adnexa: Right adnexa normal and left adnexa normal.  Musculoskeletal:     Right lower leg: No edema.     Left lower leg: No edema.  Skin:    Comments: Multiple indurated lesions to bilateral axilla.  Some tender to palpation some discharge noted on patient's shirt.  Neurological:     Mental Status: She is alert.      No results found for any visits on 12/21/22.    The 10-year ASCVD risk score (Arnett DK, et al., 2019) is: 3%    Assessment & Plan:   Problem List Items Addressed This Visit       Cardiovascular and Mediastinum   Primary hypertension    Patient was on amlodipine 5 mg on olmesartan 40 mg.  We discontinued  amlodipine due to swelling started hydrochlorothiazide 25 mg.  Patient still getting above average readings with just hydrochlorothiazide we will add back on olmesartan 20 mg prescription sent in today.  Patient to check blood pressure at home      Relevant Medications   olmesartan (BENICAR) 20 MG tablet   Other Relevant Orders   Basic Metabolic Panel     Musculoskeletal and Integument   Hidradenitis suppurativa    Likely hidradenitis suppurativa patient has several lesions under bilateral axilla.  Will treat with doxycycline 100 mg twice daily reach out if not improvement      Relevant Medications   doxycycline (VIBRA-TABS) 100 MG tablet     Other   Well woman exam - Primary    Pap smear performed in office today.  Pending results      Relevant Orders   Cytology - PAP([place])    Return in about 3 months  (around 03/21/2023) for BP recheck.    Audria Nine, NP

## 2022-12-21 NOTE — Assessment & Plan Note (Signed)
Patient was on amlodipine 5 mg on olmesartan 40 mg.  We discontinued amlodipine due to swelling started hydrochlorothiazide 25 mg.  Patient still getting above average readings with just hydrochlorothiazide we will add back on olmesartan 20 mg prescription sent in today.  Patient to check blood pressure at home

## 2022-12-21 NOTE — Assessment & Plan Note (Signed)
Pap smear performed in office today.  Pending results

## 2022-12-21 NOTE — Patient Instructions (Addendum)
Nice to see you today I will be in touch with the labs I have sent in the antibiotic to try I have sent in the olmesartn 20mg  to take with the hydrochlorothiazide

## 2022-12-21 NOTE — Assessment & Plan Note (Signed)
Likely hidradenitis suppurativa patient has several lesions under bilateral axilla.  Will treat with doxycycline 100 mg twice daily reach out if not improvement

## 2022-12-22 LAB — BASIC METABOLIC PANEL
BUN: 13 mg/dL (ref 6–23)
CO2: 29 meq/L (ref 19–32)
Calcium: 9.5 mg/dL (ref 8.4–10.5)
Chloride: 101 meq/L (ref 96–112)
Creatinine, Ser: 0.87 mg/dL (ref 0.40–1.20)
GFR: 76.68 mL/min (ref >60.00)
Glucose, Bld: 87 mg/dL (ref 70–99)
Potassium: 3.4 meq/L — ABNORMAL LOW (ref 3.5–5.1)
Sodium: 138 meq/L (ref 135–145)

## 2022-12-23 ENCOUNTER — Other Ambulatory Visit: Payer: Self-pay | Admitting: Nurse Practitioner

## 2022-12-23 DIAGNOSIS — N938 Other specified abnormal uterine and vaginal bleeding: Secondary | ICD-10-CM

## 2022-12-23 DIAGNOSIS — D219 Benign neoplasm of connective and other soft tissue, unspecified: Secondary | ICD-10-CM

## 2022-12-23 LAB — CYTOLOGY - PAP
Adequacy: ABSENT
Chlamydia: NEGATIVE
Comment: NEGATIVE
Comment: NEGATIVE
Comment: NEGATIVE
Comment: NORMAL
Diagnosis: NEGATIVE
High risk HPV: NEGATIVE
Neisseria Gonorrhea: NEGATIVE
Trichomonas: NEGATIVE

## 2022-12-27 ENCOUNTER — Encounter: Payer: Self-pay | Admitting: Nurse Practitioner

## 2022-12-27 DIAGNOSIS — D219 Benign neoplasm of connective and other soft tissue, unspecified: Secondary | ICD-10-CM

## 2022-12-27 DIAGNOSIS — N938 Other specified abnormal uterine and vaginal bleeding: Secondary | ICD-10-CM

## 2022-12-27 MED ORDER — NORETHINDRONE 0.35 MG PO TABS: 1.0000 | ORAL_TABLET | Freq: Every day | ORAL | 3 refills | Status: AC

## 2023-01-01 ENCOUNTER — Other Ambulatory Visit: Payer: Self-pay | Admitting: Nurse Practitioner

## 2023-01-06 ENCOUNTER — Telehealth: Payer: Self-pay | Admitting: Nurse Practitioner

## 2023-01-06 NOTE — Telephone Encounter (Signed)
Can we call the patient and see what her blood pressure readings have been on the omlesartan and hydrochlorothiazide

## 2023-01-06 NOTE — Telephone Encounter (Signed)
-----   Message from [facility] sent at 12/21/2022  4:14 PM EST ----- Regarding: BP numbers See what BP has been on omlesartan 20 and hydrochlorothiazide 25

## 2023-01-10 NOTE — Telephone Encounter (Signed)
 Left message to return call to our office.

## 2023-01-12 ENCOUNTER — Telehealth: Payer: Self-pay | Admitting: Nurse Practitioner

## 2023-01-12 NOTE — Telephone Encounter (Signed)
 Copied from CRM [id number]. Topic: General - Other >> Jan 10, 2023  4:53 PM Fuller Mandril wrote: Reason for CRM: Pt returning call to [facility]. Called CAL- office closed.

## 2023-01-12 NOTE — Telephone Encounter (Signed)
 Looks good. continue medication as prescribed and keep follow up as scheduled

## 2023-01-12 NOTE — Telephone Encounter (Signed)
 Relayed message to pt. See other telephone encounter.

## 2023-01-12 NOTE — Telephone Encounter (Signed)
 Contacted pt. Relayed information to pt.  Pt verbalized understanding and has no further questions or concerns.

## 2023-01-18 ENCOUNTER — Other Ambulatory Visit: Payer: Self-pay | Admitting: Nurse Practitioner

## 2023-01-18 DIAGNOSIS — I10 Essential (primary) hypertension: Secondary | ICD-10-CM

## 2023-02-21 ENCOUNTER — Inpatient Hospital Stay: Payer: 59

## 2023-02-21 ENCOUNTER — Inpatient Hospital Stay: Payer: 59 | Admitting: Internal Medicine

## 2023-03-22 ENCOUNTER — Ambulatory Visit: Payer: 59 | Admitting: Nurse Practitioner

## 2023-05-28 ENCOUNTER — Encounter: Payer: Self-pay | Admitting: Nurse Practitioner

## 2023-05-29 ENCOUNTER — Ambulatory Visit: Payer: Self-pay

## 2023-05-29 NOTE — Telephone Encounter (Signed)
 Can we triage please

## 2023-05-29 NOTE — Telephone Encounter (Signed)
 Noted

## 2023-05-29 NOTE — Telephone Encounter (Signed)
 Chief Complaint: upper abd pain Symptoms: abd pain  Frequency: 3 weeks Pertinent Negatives: Patient denies sob Disposition: [] ED /[] Urgent Care (no appt availability in office) / [x] Appointment(In office/virtual)/ []  [facility]/ [] Home Care/ [] Refused Recommended Disposition /[] [facility]/ []  Follow-up with PCP Additional Notes: pt states that for the last 2 months she has not had a menstrual cycle. States that she is also pain under both breast that radiates around to her back. States that she is doing warm lemon water and watching her diet and it is helping.  Pt states states she noticed pain is worse after eating certain foods. States pain also worse when laying down.   Copied from CRM [id number]. Topic: General - Other >> May 29, 2023  4:03 PM Howard Macho wrote: Reason for CRM: patient called stating she was advised to speak to the triage nurse in regards to a mychart message she sent Reason for Disposition  Abdominal pains regularly occur about 1 hour after meals  Answer Assessment - Initial Assessment Questions 1. LOCATION: "Where does it hurt?"      Upper abd and sides 2. RADIATION: "Does the pain shoot anywhere else?" (e.g., chest, back)     Around the back 3. ONSET: "When did the pain begin?" (e.g., minutes, hours or days ago)      3 weeks  4. SUDDEN: "Gradual or sudden onset?"     gradual 5. PATTERN "Does the pain come and go, or is it constant?"    - If it comes and goes: "How long does it last?" "Do you have pain now?"     (Note: Comes and goes means the pain is intermittent. It goes away completely between bouts.)    - If constant: "Is it getting better, staying the same, or getting worse?"      (Note: Constant means the pain never goes away completely; most serious pain is constant and gets worse.)      Comes and goes 6. SEVERITY: "How bad is the pain?"  (e.g., Scale 1-10; mild, moderate, or severe)    - MILD (1-3): Doesn't interfere with normal  activities, abdomen soft and not tender to touch..     - MODERATE (4-7): Interferes with normal activities or awakens from sleep, abdomen tender to touch.     - SEVERE (8-10): Excruciating pain, doubled over, unable to do any normal activities.       mild 7. RECURRENT SYMPTOM: "Have you ever had this type of stomach pain before?" If Yes, ask: "When was the last time?" and "What happened that time?"      no 8. AGGRAVATING FACTORS: "Does anything seem to cause this pain?" (e.g., foods, stress, alcohol)     food 9. CARDIAC SYMPTOMS: "Do you have any of the following symptoms: chest pain, difficulty breathing, sweating, nausea?"     Chest pain 10. OTHER SYMPTOMS: "Do you have any other symptoms?" (e.g., back pain, diarrhea, fever, urination pain, vomiting)       no  Protocols used: Abdominal Pain - Upper-A-AH

## 2023-05-29 NOTE — Telephone Encounter (Signed)
 Left message to return call to our office.  Sent my chart message to call office as well

## 2023-05-30 NOTE — Telephone Encounter (Signed)
 Per chart review tab pt was triaged by [facility] on 05/29/23 and already has appt to see Rosita Cools NP on 05/31/23 at 4PM. Sending note to Margarie Shay NP.

## 2023-05-31 ENCOUNTER — Ambulatory Visit: Admitting: Nurse Practitioner

## 2023-05-31 VITALS — BP 138/78 | HR 75 | Temp 97.8°F | Ht 66.0 in | Wt 226.0 lb

## 2023-05-31 DIAGNOSIS — R1010 Upper abdominal pain, unspecified: Secondary | ICD-10-CM

## 2023-05-31 DIAGNOSIS — J301 Allergic rhinitis due to pollen: Secondary | ICD-10-CM | POA: Insufficient documentation

## 2023-05-31 MED ORDER — LEVOCETIRIZINE DIHYDROCHLORIDE 5 MG PO TABS: 5.0000 mg | ORAL_TABLET | Freq: Every evening | ORAL | 1 refills | Status: AC

## 2023-05-31 NOTE — Assessment & Plan Note (Signed)
 Ambiguous in nature. If labs are negative and persists consider RUQ US  ---> HIDA. Low suspicion for cardiac etiology

## 2023-05-31 NOTE — Assessment & Plan Note (Signed)
 Will start patient on levocertizine 5mg  Daily. Sedation precautions reviewed

## 2023-05-31 NOTE — Progress Notes (Signed)
 Acute Office Visit  Subjective:     Patient ID: Alexis Torres, female    DOB: February 12, 1970, 53 y.o.   MRN: [id number]  Chief Complaint  Patient presents with   Abdominal Pain    Pt complains of upper abdominal pain that localizes on both sides. Pt states it started about a week ago and thinks its trapped gas. No urinary issues, no nausea or cramping, no constipation. Last bowel movement was an hour ago(no blood)    Medication Management    Pt complains of need for allergy medication.      Patient is in today for abdominal pain with a history of HTN and hidradenitis suppurativa   States that it started last week. She ate approx 9-930 and then after she ate it was bilateral upper abdomen. States that laying down made it worse. State that sitting up was better. States that she did drink lemon and passed gas and felt better. States that she will get the pain and she will drink lemon water and it seems like it helps  The pain was dull and would move with torso movements.  States that after she had salad with eggs it happened  Allergies: states that she will have sneezing and watery itchy eyes. Sometimes she will have itchy skin. She has tried a benadryl that works but makes her sleepy. She has not tired any other over the counter    Review of Systems  Constitutional:  Negative for chills and fever.  Respiratory:  Negative for cough and shortness of breath.   Cardiovascular:  Negative for chest pain.  Gastrointestinal:  Positive for abdominal pain. Negative for constipation, diarrhea, nausea and vomiting.       BM today that was normal  Genitourinary:  Negative for dysuria, frequency and hematuria.        Objective:    BP 138/78   Pulse 75   Temp 97.8 F (36.6 C) (Oral)   Ht 5\' 6"  (1.676 m)   Wt 226 lb (102.5 kg)   SpO2 97%   BMI 36.48 kg/m  BP Readings from Last 3 Encounters:  05/31/23 138/78  12/21/22 118/78  10/19/22 (!) 155/80   Wt Readings from Last 3  Encounters:  05/31/23 226 lb (102.5 kg)  12/21/22 234 lb (106.1 kg)  10/19/22 235 lb 12.8 oz (107 kg)   SpO2 Readings from Last 3 Encounters:  05/31/23 97%  12/21/22 99%  10/19/22 98%      Physical Exam Vitals and nursing note reviewed.  Constitutional:      Appearance: Normal appearance.  Cardiovascular:     Rate and Rhythm: Normal rate and regular rhythm.     Heart sounds: Normal heart sounds.  Pulmonary:     Effort: Pulmonary effort is normal.     Breath sounds: Normal breath sounds.  Abdominal:     General: Bowel sounds are normal.     Palpations: Abdomen is soft.     Tenderness: There is no right CVA tenderness, left CVA tenderness or guarding. Negative signs include Murphy's sign.  Neurological:     Mental Status: She is alert.     No results found for any visits on 05/31/23.      Assessment & Plan:   Problem List Items Addressed This Visit       Respiratory   Seasonal allergic rhinitis due to pollen   Will start patient on levocertizine 5mg  Daily. Sedation precautions reviewed       Relevant Medications  levocetirizine (XYZAL) 5 MG tablet     Other   Upper abdominal pain - Primary   Ambiguous in nature. If labs are negative and persists consider RUQ US  ---> HIDA. Low suspicion for cardiac etiology       Relevant Orders   CBC   Comprehensive metabolic panel with GFR   Lipase    Meds ordered this encounter  Medications   levocetirizine (XYZAL) 5 MG tablet    Sig: Take 1 tablet (5 mg total) by mouth every evening.    Dispense:  90 tablet    Refill:  1    Supervising Provider:   Deri Fleet A [[id number]]    Return for as scheduled will do CPE at that point .  Margarie Shay, NP

## 2023-06-01 LAB — COMPREHENSIVE METABOLIC PANEL WITH GFR
ALT: 14 U/L (ref 0–35)
AST: 17 U/L (ref 0–37)
Albumin: 4.1 g/dL (ref 3.5–5.2)
Alkaline Phosphatase: 49 U/L (ref 39–117)
BUN: 13 mg/dL (ref 6–23)
CO2: 30 meq/L (ref 19–32)
Calcium: 10 mg/dL (ref 8.4–10.5)
Chloride: 99 meq/L (ref 96–112)
Creatinine, Ser: 0.81 mg/dL (ref 0.40–1.20)
GFR: 83.29 mL/min (ref >60.00)
Glucose, Bld: 115 mg/dL — ABNORMAL HIGH (ref 70–99)
Potassium: 3.4 meq/L — ABNORMAL LOW (ref 3.5–5.1)
Sodium: 136 meq/L (ref 135–145)
Total Bilirubin: 0.5 mg/dL (ref 0.2–1.2)
Total Protein: 9.9 g/dL — ABNORMAL HIGH (ref 6.0–8.3)

## 2023-06-01 LAB — CBC
HCT: 35.7 % — ABNORMAL LOW (ref 36.0–46.0)
Hemoglobin: 11.7 g/dL — ABNORMAL LOW (ref 12.0–15.0)
MCHC: 32.7 g/dL (ref 30.0–36.0)
MCV: 80.8 fl (ref 78.0–100.0)
Platelets: 373 10*3/uL (ref 150.0–400.0)
RBC: 4.42 Mil/uL (ref 3.87–5.11)
RDW: 14.7 % (ref 11.5–15.5)
WBC: 6.7 10*3/uL (ref 4.0–10.5)

## 2023-06-01 LAB — LIPASE: Lipase: 56 U/L (ref 11.0–59.0)

## 2023-06-06 ENCOUNTER — Ambulatory Visit: Payer: Self-pay | Admitting: Nurse Practitioner

## 2023-06-09 ENCOUNTER — Ambulatory Visit: Admitting: Nurse Practitioner

## 2023-06-14 ENCOUNTER — Other Ambulatory Visit: Payer: Self-pay | Admitting: Nurse Practitioner

## 2023-06-14 DIAGNOSIS — I10 Essential (primary) hypertension: Secondary | ICD-10-CM

## 2023-06-16 ENCOUNTER — Other Ambulatory Visit: Payer: Self-pay | Admitting: Nurse Practitioner

## 2023-06-16 DIAGNOSIS — R1010 Upper abdominal pain, unspecified: Secondary | ICD-10-CM

## 2023-06-19 ENCOUNTER — Encounter: Payer: Self-pay | Admitting: *Deleted

## 2023-06-23 ENCOUNTER — Ambulatory Visit: Admitting: Nurse Practitioner

## 2023-07-15 ENCOUNTER — Other Ambulatory Visit: Payer: Self-pay | Admitting: Nurse Practitioner

## 2023-07-15 DIAGNOSIS — I10 Essential (primary) hypertension: Secondary | ICD-10-CM

## 2023-08-13 ENCOUNTER — Ambulatory Visit
Admission: EM | Admit: 2023-08-13 | Discharge: 2023-08-13 | Disposition: A | Discharge status: Home / Self Care | Attending: Emergency Medicine | Admitting: Emergency Medicine

## 2023-08-13 DIAGNOSIS — R109 Unspecified abdominal pain: Secondary | ICD-10-CM

## 2023-08-13 DIAGNOSIS — M545 Low back pain, unspecified: Secondary | ICD-10-CM | POA: Diagnosis not present

## 2023-08-13 MED ORDER — KETOROLAC TROMETHAMINE 30 MG/ML IJ SOLN
30.0000 mg | Freq: Once | INTRAMUSCULAR | Status: AC
  Administered 2023-08-13: 30 mg via INTRAMUSCULAR

## 2023-08-13 MED ORDER — PREDNISONE 20 MG PO TABS: 40.0000 mg | ORAL_TABLET | Freq: Every day | ORAL | 0 refills | Status: DC

## 2023-08-13 NOTE — Discharge Instructions (Signed)
 Your evaluated for your side pain and back pain which I do believe is muscular versus gas related based on your examination, able to cause pain by pressing on your body and the is located in the muscular area, you do not have any abdominal tenderness which is reassuring that most likely organs are not involved and cause of symptoms, while it may be triggered by your menstruation I do not believe this symptoms are caused by her menstruation or related to your fibroids due to area that pain is located  You have been given an injection of Toradol  to help reduce inflammation and help with pain and ideally will start to see improvement within the hour  Starting tomorrow take prednisone  every morning with food for 5 days to continue the process above, avoid ibuprofen while taking but may use Tylenol   Attempt use of an over-the-counter gas medicine such as gas that simethicone, Tums, Maalox or similar products  May use heat over the affected area and 10 to 15-minute intervals  May massage and stretch the affected area as tolerable  May place pillows surrounding the back of the thigh for general comfort  Symptoms continue to persist please follow-up with your primary doctor for any worsening symptoms please go to the nearest emergency department

## 2023-08-13 NOTE — ED Triage Notes (Signed)
 Patient states that she hasn't had a period in 3 months. Period came on and she developed a pain in her side that radiates from side to side across her back. Hurts worse when she moves a certain way. Pain has been going on for about 5 days

## 2023-08-13 NOTE — ED Provider Notes (Signed)
 CA[facility] RALPH PELT    CSN: [id number] Arrival date & time: 08/13/23  1046      History   Chief Complaint Chief Complaint  Patient presents with   Back Pain    HPI Alexis Torres is a 54 y.o. female.   Patient presents for evaluation of left flank pain wrapping to the left side of the back present for 5 days.  Occurring intermittently, not exacerbated by anything notable but pain can be felt with twisting turning and bending.  Has been taking ibuprofen and Tylenol  which has been helpful.  Initially thought to be related to constipation, took laxative and had bowel movement without change.  Denies radiating pain or numbness or tingling, injury or trauma, change in activity.  Endorses that she menstruated for 5 days recently and symptoms started while this was occurring, has not had bleeding for 3 months and believes to be perimenopausal, had similar pain on the right side 3 months ago when she experienced her last menstruation.  Unsure if related.  History of fibroids.  Denies urinary symptoms, nausea vomiting or diarrhea, vaginal discharge, itching or odor, abdominal bloating.  Has experienced increased gas production but symptom is not new and did not worsen once new symptoms began.   Past Medical History:  Diagnosis Date   Anemia    Blood transfusion without reported diagnosis February   Hypertension February    Patient Active Problem List   Diagnosis Date Noted   Upper abdominal pain 05/31/2023   Seasonal allergic rhinitis due to pollen 05/31/2023   Well woman exam 12/21/2022   Hidradenitis suppurativa 12/21/2022   Primary hypertension 06/13/2022   Obesity (BMI 30-39.9) 06/13/2022   Symptomatic anemia 02/09/2022    Past Surgical History:  Procedure Laterality Date   WISDOM TOOTH EXTRACTION      OB History     Gravida  2   Para  1   Term      Preterm      AB  1   Living  1      SAB      IAB      Ectopic      Multiple      Live Births                Home Medications    Prior to Admission medications   Medication Sig Start Date End Date Taking? Authorizing Provider  hydrochlorothiazide  (HYDRODIURIL ) 25 MG tablet TAKE 1 TABLET (25 MG TOTAL) BY MOUTH DAILY. 07/17/23  Yes Wendee Lynwood HERO, NP  norethindrone  (MICRONOR ) 0.35 MG tablet Take 1 tablet (0.35 mg total) by mouth daily. 12/27/22  Yes Wendee Lynwood HERO, NP  predniSONE  (DELTASONE ) 20 MG tablet Take 2 tablets (40 mg total) by mouth daily. 08/13/23  Yes Teresa Shelba SAUNDERS, NP  levocetirizine (XYZAL ) 5 MG tablet Take 1 tablet (5 mg total) by mouth every evening. 05/31/23   Wendee Lynwood HERO, NP  olmesartan  (BENICAR ) 20 MG tablet TAKE 1 TABLET BY MOUTH EVERY DAY 06/14/23   Wendee Lynwood HERO, NP    Family History Family History  Problem Relation Age of Onset   Cancer Mother        COLON   Stomach cancer Mother    Heart disease Father    Lung cancer Father    Cancer Father    Seizures Sister        EPILEPSY   Hypertension Paternal Aunt    Hypertension Cousin     Social History Social  History   Tobacco Use   Smoking status: Former    Current packs/day: 0.00    Types: Cigarettes, Cigars    Quit date: 04/25/2019    Years since quitting: 4.3   Smokeless tobacco: Never   Tobacco comments:    QUIT IN DEC.  Vaping Use   Vaping status: Never Used  Substance Use Topics   Alcohol use: No   Drug use: No     Allergies   Patient has no known allergies.   Review of Systems Review of Systems   Physical Exam Triage Vital Signs ED Triage Vitals [08/13/23 1053]  Encounter Vitals Group     BP (!) 183/98     Girls Systolic BP Percentile      Girls Diastolic BP Percentile      Boys Systolic BP Percentile      Boys Diastolic BP Percentile      Pulse Rate 76     Resp 17     Temp 98.2 F (36.8 C)     Temp src      SpO2 97 %     Weight      Height      Head Circumference      Peak Flow      Pain Score 5     Pain Loc      Pain Education      Exclude from Growth Chart     No data found.  Updated Vital Signs BP (!) 183/98 (BP Location: Right Arm)   Pulse 76   Temp 98.2 F (36.8 C)   Resp 17   LMP 07/31/2023 (Exact Date)   SpO2 97%   Visual Acuity Right Eye Distance:   Left Eye Distance:   Bilateral Distance:    Right Eye Near:   Left Eye Near:    Bilateral Near:     Physical Exam Constitutional:      Appearance: Normal appearance.  Eyes:     Extraocular Movements: Extraocular movements intact.  Pulmonary:     Effort: Pulmonary effort is normal.  Abdominal:     General: Abdomen is flat. Bowel sounds are normal. There is no distension.     Palpations: Abdomen is soft.     Tenderness: There is no abdominal tenderness. There is no right CVA tenderness, left CVA tenderness or guarding.  Musculoskeletal:     Comments: Tenderness Is present in the left lower lumbar region without ecchymosis swelling or deformity, able to sit erect without complication, negative straight leg test  Tenderness is present to the lower left flank, no CVA tenderness noted  Neurological:     Mental Status: She is alert and oriented to person, place, and time. Mental status is at baseline.      UC Treatments / Results  Labs (all labs ordered are listed, but only abnormal results are displayed) Labs Reviewed - No data to display  EKG   Radiology No results found.  Procedures Procedures (including critical care time)  Medications Ordered in UC Medications  ketorolac  (TORADOL ) 30 MG/ML injection 30 mg (30 mg Intramuscular Given 08/13/23 1136)    Initial Impression / Assessment and Plan / UC Course  I have reviewed the triage vital signs and the nursing notes.  Pertinent labs & imaging results that were available during my care of the patient were reviewed by me and considered in my medical decision making (see chart for details).  Flank pain acute left-sided low back pain without sciatica  At this  time etiology appears to be muscular, discussed with  patient, no spinal tenderness noted on exam, no signs of saddle anesthesia, stable, denies injury therefore imaging deferred, possibly triggered by menstruation, discussed muscular irritation could be coming from contracture of muscles but denying pelvic pain low suspicion for involvement of the fibroids, no abdominal tenderness noted on exam patient is in no signs of distress nontoxic-appearing, stable for outpatient management, Toradol  IM given and prescribed prednisone  his symptoms are responsive to over-the-counter analgesics, recommended supportive care through [facility] and advised follow-up with primary doctor if symptoms continue to persist for any worsening symptoms she has been recommended to go to the nearest emergency department Final Clinical Impressions(s) / UC Diagnoses   Final diagnoses:  Left flank pain  Acute left-sided low back pain without sciatica     Discharge Instructions      Your evaluated for your side pain and back pain which I do believe is muscular versus gas related based on your examination, able to cause pain by pressing on your body and the is located in the muscular area, you do not have any abdominal tenderness which is reassuring that most likely organs are not involved and cause of symptoms, while it may be triggered by your menstruation I do not believe this symptoms are caused by her menstruation or related to your fibroids due to area that pain is located  You have been given an injection of Toradol  to help reduce inflammation and help with pain and ideally will start to see improvement within the hour  Starting tomorrow take prednisone  every morning with food for 5 days to continue the process above, avoid ibuprofen while taking but may use Tylenol   Attempt use of an over-the-counter gas medicine such as gas that simethicone, Tums, Maalox or similar products  May use heat over the affected area and 10 to 15-minute intervals  May massage and stretch the  affected area as tolerable  May place pillows surrounding the back of the thigh for general comfort  Symptoms continue to persist please follow-up with your primary doctor for any worsening symptoms please go to the nearest emergency department    ED Prescriptions     Medication Sig Dispense Auth. Provider   predniSONE  (DELTASONE ) 20 MG tablet Take 2 tablets (40 mg total) by mouth daily. 10 tablet [place] Dube R, NP      PDMP not reviewed this encounter.   Teresa Shelba SAUNDERS, [place] 08/13/23 1218

## 2023-10-07 ENCOUNTER — Encounter: Payer: Self-pay | Admitting: Internal Medicine

## 2023-10-07 ENCOUNTER — Telehealth: Admitting: Family

## 2023-10-07 DIAGNOSIS — R059 Cough, unspecified: Secondary | ICD-10-CM

## 2023-10-07 MED ORDER — PREDNISONE 10 MG (21) PO TBPK: ORAL_TABLET | ORAL | 0 refills | Status: DC

## 2023-10-07 MED ORDER — BENZONATATE 100 MG PO CAPS: 100.0000 mg | ORAL_CAPSULE | Freq: Three times a day (TID) | ORAL | 0 refills | Status: AC | PRN

## 2023-10-07 NOTE — Progress Notes (Signed)
E-Visit for Cough  We are sorry that you are not feeling well.  Here is how we plan to help!  Based on your presentation I believe you most likely have A cough due to a virus.  This is called viral bronchitis and is best treated by rest, plenty of fluids and control of the cough.  You may use Ibuprofen or Tylenol as directed to help your symptoms.     In addition you may use A prescription cough medication called Tessalon Perles 100mg . You may take 1-2 capsules every 8 hours as needed for your cough.   Directions for 6 day taper: Day 1: 2 tablets before breakfast, 1 after both lunch & dinner and 2 at bedtime Day 2: 1 tab before breakfast, 1 after both lunch & dinner and 2 at bedtime Day 3: 1 tab at each meal & 1 at bedtime Day 4: 1 tab at breakfast, 1 at lunch, 1 at bedtime Day 5: 1 tab at breakfast & 1 tab at bedtime Day 6: 1 tab at breakfast  From your responses in the eVisit questionnaire you describe inflammation in the upper respiratory tract which is causing a significant cough.  This is commonly called Bronchitis and has four common causes:   Allergies Viral Infections Acid Reflux Bacterial Infection Allergies, viruses and acid reflux are treated by controlling symptoms or eliminating the cause. An example might be a cough caused by taking certain blood pressure medications. You stop the cough by changing the medication. Another example might be a cough caused by acid reflux. Controlling the reflux helps control the cough.  USE OF BRONCHODILATOR ("RESCUE") INHALERS: There is a risk from using your bronchodilator too frequently.  The risk is that over-reliance on a medication which only relaxes the muscles surrounding the breathing tubes can reduce the effectiveness of medications prescribed to reduce swelling and congestion of the tubes themselves.  Although you feel brief relief from the bronchodilator inhaler, your asthma may actually be worsening with the tubes becoming more  swollen and filled with mucus.  This can delay other crucial treatments, such as oral steroid medications. If you need to use a bronchodilator inhaler daily, several times per day, you should discuss this with your provider.  There are probably better treatments that could be used to keep your asthma under control.     HOME CARE Only take medications as instructed by your medical team. Complete the entire course of an antibiotic. Drink plenty of fluids and get plenty of rest. Avoid close contacts especially the very young and the elderly Cover your mouth if you cough or cough into your sleeve. Always remember to wash your hands A steam or ultrasonic humidifier can help congestion.   GET HELP RIGHT AWAY IF: You develop worsening fever. You become short of breath You cough up blood. Your symptoms persist after you have completed your treatment plan MAKE SURE YOU  Understand these instructions. Will watch your condition. Will get help right away if you are not doing well or get worse.    Thank you for choosing an e-visit.  Your e-visit answers were reviewed by a board certified advanced clinical practitioner to complete your personal care plan. Depending upon the condition, your plan could have included both over the counter or prescription medications.  Please review your pharmacy choice. Make sure the pharmacy is open so you can pick up prescription now. If there is a problem, you may contact your provider through [employer] and have the prescription  routed to another pharmacy.  Your safety is important to [place]. If you have drug allergies check your prescription carefully.   For the next 24 hours you can use MyChart to ask questions about today's visit, request a non-urgent call back, or ask for a work or school excuse. You will get an email in the next two days asking about your experience. I hope that your e-visit has been valuable and will speed your recovery. I have spent 5  minutes in review of e-visit questionnaire, review and updating patient chart, medical decision making and response to patient.   Reed Pandy, PA-C

## 2023-10-17 ENCOUNTER — Telehealth: Admitting: Physician Assistant

## 2023-10-17 DIAGNOSIS — J208 Acute bronchitis due to other specified organisms: Secondary | ICD-10-CM | POA: Diagnosis not present

## 2023-10-17 DIAGNOSIS — B9689 Other specified bacterial agents as the cause of diseases classified elsewhere: Secondary | ICD-10-CM

## 2023-10-17 MED ORDER — AZITHROMYCIN 250 MG PO TABS: ORAL_TABLET | ORAL | 0 refills | Status: AC

## 2023-10-17 MED ORDER — BENZONATATE 200 MG PO CAPS: 200.0000 mg | ORAL_CAPSULE | Freq: Two times a day (BID) | ORAL | 0 refills | Status: AC | PRN

## 2023-10-17 NOTE — Progress Notes (Signed)
 We are sorry that you are not feeling well.  Here is how we plan to help!  Based on your presentation I believe you most likely have A cough due to bacteria.  When patients have a fever and a productive cough with a change in color or increased sputum production, we are concerned about bacterial bronchitis.  If left untreated it can progress to pneumonia.  If your symptoms do not improve with your treatment plan it is important that you contact your provider.   I have prescribed Azithromyin 250 mg: two tablets now and then one tablet daily for 4 additonal days    In addition you may use A prescription cough medication called Tessalon  Perles 100mg . You may take 1-2 capsules every 8 hours as needed for your cough.  From your responses in the eVisit questionnaire you describe inflammation in the upper respiratory tract which is causing a significant cough.  This is commonly called Bronchitis and has four common causes:   Allergies Viral Infections Acid Reflux Bacterial Infection Allergies, viruses and acid reflux are treated by controlling symptoms or eliminating the cause. An example might be a cough caused by taking certain blood pressure medications. You stop the cough by changing the medication. Another example might be a cough caused by acid reflux. Controlling the reflux helps control the cough.  USE OF BRONCHODILATOR (RESCUE) INHALERS: There is a risk from using your bronchodilator too frequently.  The risk is that over-reliance on a medication which only relaxes the muscles surrounding the breathing tubes can reduce the effectiveness of medications prescribed to reduce swelling and congestion of the tubes themselves.  Although you feel brief relief from the bronchodilator inhaler, your asthma may actually be worsening with the tubes becoming more swollen and filled with mucus.  This can delay other crucial treatments, such as oral steroid medications. If you need to use a bronchodilator inhaler  daily, several times per day, you should discuss this with your provider.  There are probably better treatments that could be used to keep your asthma under control.     HOME CARE Only take medications as instructed by your medical team. Complete the entire course of an antibiotic. Drink plenty of fluids and get plenty of rest. Avoid close contacts especially the very young and the elderly Cover your mouth if you cough or cough into your sleeve. Always remember to wash your hands A steam or ultrasonic humidifier can help congestion.   GET HELP RIGHT AWAY IF: You develop worsening fever. You become short of breath You cough up blood. Your symptoms persist after you have completed your treatment plan MAKE SURE YOU  Understand these instructions. Will watch your condition. Will get help right away if you are not doing well or get worse.  Your e-visit answers were reviewed by a board certified advanced clinical practitioner to complete your personal care plan.  Depending on the condition, your plan could have included both over the counter or prescription medications. If there is a problem please reply  once you have received a response from your provider. Your safety is important to us .  If you have drug allergies check your prescription carefully.    You can use MyChart to ask questions about today's visit, request a non-urgent call back, or ask for a work or school excuse for 24 hours related to this e-Visit. If it has been greater than 24 hours you will need to follow up with your provider, or enter a new e-Visit to  address those concerns. You will get an e-mail in the next two days asking about your experience.  I hope that your e-visit has been valuable and will speed your recovery. Thank you for using e-visits.   I have spent 5 minutes in review of e-visit questionnaire, review and updating patient chart, medical decision making and response to patient.   Mart Colpitts, FNP

## 2023-10-25 ENCOUNTER — Telehealth: Admitting: Physician Assistant

## 2023-10-25 DIAGNOSIS — R058 Other specified cough: Secondary | ICD-10-CM | POA: Diagnosis not present

## 2023-10-25 MED ORDER — PREDNISONE 10 MG (21) PO TBPK
ORAL_TABLET | ORAL | 0 refills | Status: AC
Start: 2023-10-25 — End: ?

## 2023-10-25 NOTE — Progress Notes (Signed)
 We are sorry that you are not feeling well.  Here is how we plan to help!  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 This is a chronic cough after a moderate upper respiratory infection that is caused from residual inflammation in the airway from the previous infection.  In addition you may use A non-prescription cough medication called Mucinex DM: take 2 tablets every 12 hours.  Prednisone  10 mg daily for 6 days (see taper instructions below)  Directions for 6 day taper: Day 1: 2 tablets before breakfast, 1 after both lunch & dinner and 2 at bedtime Day 2: 1 tab before breakfast, 1 after both lunch & dinner and 2 at bedtime Day 3: 1 tab at each meal & 1 at bedtime Day 4: 1 tab at breakfast, 1 at lunch, 1 at bedtime Day 5: 1 tab at breakfast & 1 tab at bedtime Day 6: 1 tab at breakfast  From your responses in the eVisit questionnaire you describe inflammation in the upper respiratory tract which is causing a significant cough.  This is commonly called Bronchitis and has four common causes:   Allergies Viral Infections Acid Reflux Bacterial Infection Allergies, viruses and acid reflux are treated by controlling symptoms or eliminating the cause. An example might be a cough caused by taking certain blood pressure medications. You stop the cough by changing the medication. Another example might be a cough caused by acid reflux. Controlling the reflux helps control the cough.  USE OF BRONCHODILATOR (RESCUE) INHALERS: There is a risk from using your bronchodilator too frequently.  The risk is that over-reliance on a medication which only relaxes the muscles surrounding the breathing tubes can reduce the effectiveness of medications prescribed to reduce swelling and congestion of the tubes themselves.  Although you feel brief relief from the bronchodilator inhaler, your asthma may actually be worsening with the tubes becoming more swollen and  filled with mucus.  This can delay other crucial treatments, such as oral steroid medications. If you need to use a bronchodilator inhaler daily, several times per day, you should discuss this with your provider.  There are probably better treatments that could be used to keep your asthma under control.     HOME CARE Only take medications as instructed by your medical team. Complete the entire course of an antibiotic. Drink plenty of fluids and get plenty of rest. Avoid close contacts especially the very young and the elderly Cover your mouth if you cough or cough into your sleeve. Always remember to wash your hands A steam or ultrasonic humidifier can help congestion.   GET HELP RIGHT AWAY IF: You develop worsening fever. You become short of breath You cough up blood. Your symptoms persist after you have completed your treatment plan MAKE SURE YOU  Understand these instructions. Will watch your condition. Will get help right away if you are not doing well or get worse.  Your e-visit answers were reviewed by a board certified advanced clinical practitioner to complete your personal care plan.  Depending on the condition, your plan could have included both over the counter or prescription medications. If there is a problem please reply  once you have received a response from your provider. Your safety is important to us .  If you have drug allergies check your prescription carefully.    You can use MyChart to ask questions about today's visit, request a non-urgent call back, or ask for a work or school excuse  for 24 hours related to this e-Visit. If it has been greater than 24 hours you will need to follow up with your provider, or enter a new e-Visit to address those concerns. You will get an e-mail in the next two days asking about your experience.  I hope that your e-visit has been valuable and will speed your recovery. Thank you for using e-visits.   I have spent 5 minutes in review of  e-visit questionnaire, review and updating patient chart, medical decision making and response to patient.   Delon CHRISTELLA Dickinson, PA-C

## 2023-11-22 ENCOUNTER — Other Ambulatory Visit: Payer: Self-pay | Admitting: Nurse Practitioner

## 2023-11-22 DIAGNOSIS — D219 Benign neoplasm of connective and other soft tissue, unspecified: Secondary | ICD-10-CM

## 2023-11-22 DIAGNOSIS — N938 Other specified abnormal uterine and vaginal bleeding: Secondary | ICD-10-CM

## 2024-01-11 ENCOUNTER — Other Ambulatory Visit: Payer: Self-pay | Admitting: Nurse Practitioner

## 2024-01-11 DIAGNOSIS — I10 Essential (primary) hypertension: Secondary | ICD-10-CM
